# Patient Record
Sex: Female | Born: 1935 | Race: White | Hispanic: No | Smoking: Former smoker
Health system: Southern US, Community
[De-identification: ages and names within clinical notes are randomized; demographics above are authoritative.]

## PROBLEM LIST (undated history)

## (undated) DIAGNOSIS — Z7901 Long term (current) use of anticoagulants: Secondary | ICD-10-CM

## (undated) DIAGNOSIS — Z86010 Personal history of colonic polyps: Secondary | ICD-10-CM

## (undated) DIAGNOSIS — E119 Type 2 diabetes mellitus without complications: Secondary | ICD-10-CM

## (undated) DIAGNOSIS — E039 Hypothyroidism, unspecified: Secondary | ICD-10-CM

## (undated) DIAGNOSIS — I10 Essential (primary) hypertension: Secondary | ICD-10-CM

## (undated) DIAGNOSIS — M1990 Unspecified osteoarthritis, unspecified site: Secondary | ICD-10-CM

## (undated) DIAGNOSIS — I4891 Unspecified atrial fibrillation: Principal | ICD-10-CM

## (undated) DIAGNOSIS — F419 Anxiety disorder, unspecified: Secondary | ICD-10-CM

## (undated) DIAGNOSIS — F17201 Nicotine dependence, unspecified, in remission: Secondary | ICD-10-CM

## (undated) HISTORY — DX: Hypothyroidism, unspecified: E03.9

## (undated) HISTORY — DX: Nicotine dependence, unspecified, in remission: F17.201

## (undated) HISTORY — PX: OTHER SURGICAL HISTORY: SHX169

## (undated) HISTORY — DX: Personal history of colonic polyps: Z86.010

## (undated) HISTORY — DX: Unspecified atrial fibrillation: I48.91

## (undated) HISTORY — DX: Type 2 diabetes mellitus without complications: E11.9

## (undated) HISTORY — DX: Long term (current) use of anticoagulants: Z79.01

---

## 1977-07-06 HISTORY — PX: HERNIA REPAIR: SHX51

## 2004-05-23 ENCOUNTER — Ambulatory Visit (HOSPITAL_COMMUNITY): Admission: RE | Admit: 2004-05-23 | Discharge: 2004-05-23 | Payer: Self-pay | Admitting: Family Medicine

## 2004-06-04 ENCOUNTER — Ambulatory Visit (HOSPITAL_COMMUNITY): Admission: RE | Admit: 2004-06-04 | Discharge: 2004-06-04 | Payer: Self-pay | Admitting: Family Medicine

## 2005-07-06 DIAGNOSIS — Z860101 Personal history of adenomatous and serrated colon polyps: Secondary | ICD-10-CM

## 2005-07-06 DIAGNOSIS — Z86010 Personal history of colonic polyps: Secondary | ICD-10-CM

## 2005-07-06 HISTORY — DX: Personal history of colonic polyps: Z86.010

## 2005-07-06 HISTORY — PX: COLONOSCOPY W/ POLYPECTOMY: SHX1380

## 2005-07-06 HISTORY — DX: Personal history of adenomatous and serrated colon polyps: Z86.0101

## 2005-07-09 ENCOUNTER — Ambulatory Visit (HOSPITAL_COMMUNITY): Admission: RE | Admit: 2005-07-09 | Discharge: 2005-07-09 | Payer: Self-pay | Admitting: Family Medicine

## 2006-01-20 ENCOUNTER — Ambulatory Visit (HOSPITAL_COMMUNITY): Admission: RE | Admit: 2006-01-20 | Discharge: 2006-01-20 | Payer: Self-pay | Admitting: Gastroenterology

## 2006-01-20 ENCOUNTER — Ambulatory Visit: Payer: Self-pay | Admitting: Gastroenterology

## 2006-01-20 ENCOUNTER — Encounter (INDEPENDENT_AMBULATORY_CARE_PROVIDER_SITE_OTHER): Payer: Self-pay | Admitting: Specialist

## 2006-07-12 ENCOUNTER — Ambulatory Visit (HOSPITAL_COMMUNITY): Admission: RE | Admit: 2006-07-12 | Discharge: 2006-07-12 | Payer: Self-pay | Admitting: Family Medicine

## 2006-11-10 ENCOUNTER — Ambulatory Visit (HOSPITAL_COMMUNITY): Admission: RE | Admit: 2006-11-10 | Discharge: 2006-11-10 | Payer: Self-pay | Admitting: Family Medicine

## 2007-07-15 ENCOUNTER — Ambulatory Visit (HOSPITAL_COMMUNITY): Admission: RE | Admit: 2007-07-15 | Discharge: 2007-07-15 | Payer: Self-pay | Admitting: Family Medicine

## 2008-07-23 ENCOUNTER — Ambulatory Visit (HOSPITAL_COMMUNITY): Admission: RE | Admit: 2008-07-23 | Discharge: 2008-07-23 | Payer: Self-pay | Admitting: Family Medicine

## 2009-07-29 ENCOUNTER — Ambulatory Visit (HOSPITAL_COMMUNITY)
Admission: RE | Admit: 2009-07-29 | Discharge: 2009-07-29 | Payer: Self-pay | Source: Home / Self Care | Admitting: Family Medicine

## 2010-07-06 DIAGNOSIS — Z7901 Long term (current) use of anticoagulants: Secondary | ICD-10-CM

## 2010-07-06 DIAGNOSIS — I4891 Unspecified atrial fibrillation: Secondary | ICD-10-CM

## 2010-07-06 HISTORY — DX: Long term (current) use of anticoagulants: Z79.01

## 2010-07-06 HISTORY — DX: Unspecified atrial fibrillation: I48.91

## 2010-07-28 ENCOUNTER — Encounter: Payer: Self-pay | Admitting: Family Medicine

## 2010-07-31 ENCOUNTER — Ambulatory Visit (HOSPITAL_COMMUNITY)
Admission: RE | Admit: 2010-07-31 | Discharge: 2010-07-31 | Payer: Self-pay | Source: Home / Self Care | Attending: Family Medicine | Admitting: Family Medicine

## 2010-11-21 NOTE — Op Note (Signed)
NAMEKIWANA, DEBLASI                 ACCOUNT NO.:  [id number]   MEDICAL RECORD NO.:  [id number]          PATIENT TYPE:  AMB   LOCATION:  DAY                           FACILITY:  [facility]   PHYSICIAN:  Kassie Mends, M.D.      DATE OF BIRTH:  Jul 17, 1935   DATE OF PROCEDURE:  01/20/2006  DATE OF DISCHARGE:                                 OPERATIVE REPORT   PROCEDURE:  Colonoscopy with cold snare and cold forceps biopsy, snare  cautery.   MEDICATIONS:  1.  Demerol 75 mg IV.  2.  Versed 4 mg IV.   INDICATIONS FOR EXAM:  Autumn Powers is a 75 year old female who presents for  average risk colon cancer screening.   FINDINGS:  1.  Multiple 4 mm to 6 mm polyps seen throughout the colon.  The polyps were      removed using cold snare, cold forceps, and snare cautery.  2.  6 mm sessile rectal polyp removed via snare cautery.  3.  Sigmoid diverticulosis.  4.  Otherwise no masses, inflammatory changes or vascular ectasia seen.  No      internal hemorrhoids.   RECOMMENDATIONS:  1.  No aspirin, NSAIDs or anticoagulation for 7 days.  2.  Will follow up biopsies.  If polyps adenomatous, then screening      colonoscopy needs to be performed in 5 years.  3.  Follow up with Dr. Janna Arch.   PROCEDURE TECHNIQUE:  Physical exam was performed and informed consent was  obtained from the patient after explaining all risks (perforation, bleeding,  infection, adverse effects to medicines), benefits, alternatives to the  procedure which the patient appeared to understand and so stated.  The  patient was connected to the monitoring device and placed in the left  lateral position.  Continuous oxygen was provided by nasal cannula and IV  medicine administered through an indwelling cannula.  After administration  of sedation and rectal exam, the patient's rectum was intubated.  The scope  was advanced under direct visualization to the cecum.  Multiple colon polyps  and a single  rectal polyp were removed using cold  biopsy technique and snare cautery with  the Erbe at standard settings (20 W).  The scope was subsequently removed  slowly by carefully examining the color, texture, anatomy mucosa on the way  out.  The patient was recovered in the endoscopy suite and discharged home  in satisfactory condition.      Kassie Mends, M.D.  Electronically Signed     SM/MEDQ  D:  01/20/2006  T:  01/21/2006  Job:  [id number]   cc:   Melvyn Novas, MD  Fax: [phone]

## 2011-01-08 ENCOUNTER — Encounter: Payer: Self-pay | Admitting: Gastroenterology

## 2011-01-19 ENCOUNTER — Ambulatory Visit: Payer: Self-pay | Admitting: Gastroenterology

## 2011-01-27 ENCOUNTER — Encounter: Payer: Self-pay | Admitting: Gastroenterology

## 2011-01-27 ENCOUNTER — Ambulatory Visit (INDEPENDENT_AMBULATORY_CARE_PROVIDER_SITE_OTHER): Payer: Medicare Other | Admitting: Gastroenterology

## 2011-01-27 VITALS — BP 123/74 | HR 66 | Temp 97.6°F | Ht 66.0 in | Wt 97.2 lb

## 2011-01-27 DIAGNOSIS — Z86010 Personal history of colonic polyps: Secondary | ICD-10-CM

## 2011-01-27 NOTE — Patient Instructions (Signed)
We have set you up for a colonoscopy with Dr. Fields.  No changes to your medications at this time.   

## 2011-01-27 NOTE — Assessment & Plan Note (Signed)
75 year old Caucasian female with hx of adenomatous polyps on colonoscopy in 2007. Due for surveillance now. Denies abdominal pain, change in bowel habits, rectal bleeding, nausea or vomiting. She is quite thin, but she states she fluctuates between 95 and 105 lbs. Has no other concerns and desires to proceed.  Proceed with colonoscopy with Dr. Darrick Penna in the near future. The risks, benefits, and alternatives have been discussed in detail with the patient. She states understanding and desires to proceed.

## 2011-01-27 NOTE — Progress Notes (Signed)
Primary Care Physician:  Isabella Stalling, MD Primary Gastroenterologist:  Dr. Darrick Penna   Chief Complaint  Patient presents with  . Colonoscopy    HPI:   Autumn Powers is a 75 year old Caucasian female who presents today for updated surveillance colonoscopy. Last colonoscopy in 2007 with Dr. Darrick Penna, multiple polyps throughout colon. Biopsy: adenomatous and hyperplastic. She is a thin-appearing but healthy female; reports weighing between 95 and 105 lbs. She denies any change in bowel habits. Has BM every other day. No rectal bleeding. Denies N/V and abdominal pain. Denies dysphagia, odynophagia.   She has no concerns today.    Past Medical History  Diagnosis Date  . Hypothyroidism   . Osteoporosis   . Stress     r/t son's health   . S/P colonoscopy 2007    Adenomatous, hyperplastic polyps    Past Surgical History  Procedure Date  . Right inguinal hernia repair     Current Outpatient Prescriptions  Medication Sig Dispense Refill  . alendronate (FOSAMAX) 70 MG tablet Take 70 mg by mouth every 7 (seven) days. Take with a full glass of water on an empty stomach.       . levothyroxine (SYNTHROID, LEVOTHROID) 50 MCG tablet Take 50 mcg by mouth daily.        Marland Kitchen PARoxetine (PAXIL) 20 MG tablet Take 20 mg by mouth every morning.        . rosuvastatin (CRESTOR) 10 MG tablet Take 10 mg by mouth daily.          Allergies as of 01/27/2011  . (No Known Allergies)    Family History  Problem Relation Age of Onset  . Colon cancer Neg Hx     History   Social History  . Marital Status: Divorced    Spouse Name: N/A    Number of Children: N/A  . Years of Education: N/A   Occupational History  . Not on file.   Social History Main Topics  . Smoking status: Former Smoker -- 1.0 packs/day for 30 years    Types: Cigarettes  . Smokeless tobacco: Not on file   Comment: quit at the age of 89 years age  . Alcohol Use: No  . Drug Use: No  . Sexually Active: Not on file    Review of  Systems: Gen: Denies any fever, chills, fatigue, significant weight loss, lack of appetite.  CV: Denies chest pain, heart palpitations, peripheral edema, syncope.  Resp: Denies shortness of breath at rest or with exertion. Denies wheezing or cough.  GI: Denies dysphagia or odynophagia. Denies jaundice, hematemesis, fecal incontinence. GU : Denies urinary burning, urinary frequency, urinary hesitancy MS: Denies joint pain, muscle weakness, cramps, or limitation of movement.  Derm: Denies rash, itching, dry skin Psych: Denies depression, anxiety, memory loss, and confusion Heme: Denies bruising, bleeding, and enlarged lymph nodes.  Physical Exam: BP 123/74  Pulse 66  Temp(Src) 97.6 F (36.4 C) (Temporal)  Ht 5\' 6"  (1.676 m)  Wt 97 lb 3.2 oz (44.09 kg)  BMI 15.69 kg/m2 General:   Alert and oriented. Pleasant and cooperative. Thin but healthy appearing.  Head:  Normocephalic and atraumatic. Eyes:  Without icterus, sclera clear and conjunctiva pink.  Ears:  Normal auditory acuity. Nose:  No deformity, discharge,  or lesions. Mouth:  No deformity or lesions, oral mucosa pink.  Neck:  Supple, without mass or thyromegaly. Lungs:  Clear to auscultation bilaterally. Diminished bases.  Heart:  S1, S2 present without murmurs appreciated.  Abdomen:  +BS,  soft, non-tender and non-distended. Thin. No HSM noted. No guarding or rebound. No masses appreciated.  Rectal:  Deferred until colonoscopy.  Msk:  Symmetrical without gross deformities. Normal posture. Extremities:  Without clubbing or edema. Neurologic:  Alert and  oriented x4;  grossly normal neurologically. Skin:  Intact without significant lesions or rashes. Cervical Nodes:  No significant cervical adenopathy. Psych:  Alert and cooperative. Normal mood and affect.

## 2011-01-28 NOTE — Progress Notes (Signed)
Cc to PCP 

## 2011-02-04 MED ORDER — SODIUM CHLORIDE 0.45 % IV SOLN
Freq: Once | INTRAVENOUS | Status: AC
  Administered 2011-02-05: 1000 mL via INTRAVENOUS

## 2011-02-05 ENCOUNTER — Ambulatory Visit (HOSPITAL_COMMUNITY)
Admission: RE | Admit: 2011-02-05 | Discharge: 2011-02-05 | Disposition: A | Discharge status: Nursing Home (Includes ICF) | Payer: Medicare Other | Source: Ambulatory Visit | Attending: Gastroenterology | Admitting: Gastroenterology

## 2011-02-05 ENCOUNTER — Encounter (HOSPITAL_COMMUNITY)
Admission: RE | Disposition: A | Discharge status: Nursing Home (Includes ICF) | Payer: Self-pay | Source: Ambulatory Visit | Attending: Gastroenterology

## 2011-02-05 ENCOUNTER — Emergency Department (HOSPITAL_COMMUNITY)
Admission: EM | Admit: 2011-02-05 | Discharge: 2011-02-05 | Disposition: A | Discharge status: Home / Self Care | Payer: Medicare Other | Attending: Emergency Medicine | Admitting: Emergency Medicine

## 2011-02-05 ENCOUNTER — Other Ambulatory Visit: Payer: Self-pay | Admitting: Gastroenterology

## 2011-02-05 ENCOUNTER — Other Ambulatory Visit: Payer: Self-pay

## 2011-02-05 ENCOUNTER — Encounter (HOSPITAL_COMMUNITY): Payer: Self-pay | Admitting: *Deleted

## 2011-02-05 ENCOUNTER — Encounter: Payer: Medicare Other | Admitting: Gastroenterology

## 2011-02-05 DIAGNOSIS — Z860100 Personal history of colon polyps, unspecified: Secondary | ICD-10-CM | POA: Insufficient documentation

## 2011-02-05 DIAGNOSIS — M810 Age-related osteoporosis without current pathological fracture: Secondary | ICD-10-CM | POA: Insufficient documentation

## 2011-02-05 DIAGNOSIS — I498 Other specified cardiac arrhythmias: Secondary | ICD-10-CM | POA: Insufficient documentation

## 2011-02-05 DIAGNOSIS — Z87891 Personal history of nicotine dependence: Secondary | ICD-10-CM | POA: Insufficient documentation

## 2011-02-05 DIAGNOSIS — F411 Generalized anxiety disorder: Secondary | ICD-10-CM | POA: Insufficient documentation

## 2011-02-05 DIAGNOSIS — E039 Hypothyroidism, unspecified: Secondary | ICD-10-CM | POA: Insufficient documentation

## 2011-02-05 DIAGNOSIS — K648 Other hemorrhoids: Secondary | ICD-10-CM

## 2011-02-05 DIAGNOSIS — R002 Palpitations: Secondary | ICD-10-CM | POA: Insufficient documentation

## 2011-02-05 DIAGNOSIS — K5730 Diverticulosis of large intestine without perforation or abscess without bleeding: Secondary | ICD-10-CM

## 2011-02-05 DIAGNOSIS — D126 Benign neoplasm of colon, unspecified: Secondary | ICD-10-CM

## 2011-02-05 DIAGNOSIS — R000 Tachycardia, unspecified: Secondary | ICD-10-CM

## 2011-02-05 DIAGNOSIS — Z09 Encounter for follow-up examination after completed treatment for conditions other than malignant neoplasm: Secondary | ICD-10-CM | POA: Insufficient documentation

## 2011-02-05 DIAGNOSIS — M19079 Primary osteoarthritis, unspecified ankle and foot: Secondary | ICD-10-CM | POA: Insufficient documentation

## 2011-02-05 HISTORY — PX: COLONOSCOPY: SHX5424

## 2011-02-05 HISTORY — DX: Unspecified osteoarthritis, unspecified site: M19.90

## 2011-02-05 HISTORY — DX: Anxiety disorder, unspecified: F41.9

## 2011-02-05 LAB — CBC
Hemoglobin: 13 g/dL (ref 12.0–15.0)
MCH: 29.9 pg (ref 26.0–34.0)
MCV: 93.1 fL (ref 78.0–100.0)
Platelets: 186 10*3/uL (ref 150–400)
RBC: 4.35 MIL/uL (ref 3.87–5.11)
WBC: 8.2 10*3/uL (ref 4.0–10.5)

## 2011-02-05 LAB — BASIC METABOLIC PANEL
BUN: 6 mg/dL (ref 6–23)
CO2: 24 mEq/L (ref 19–32)
Glucose, Bld: 98 mg/dL (ref 70–99)
Potassium: 4.5 mEq/L (ref 3.5–5.1)
Sodium: 137 mEq/L (ref 135–145)

## 2011-02-05 LAB — DIFFERENTIAL
Eosinophils Absolute: 0.2 10*3/uL (ref 0.0–0.7)
Eosinophils Relative: 3 % (ref 0–5)
Lymphocytes Relative: 25 % (ref 12–46)
Lymphs Abs: 2 10*3/uL (ref 0.7–4.0)
Monocytes Relative: 6 % (ref 3–12)
Neutrophils Relative %: 66 % (ref 43–77)

## 2011-02-05 LAB — CARDIAC PANEL(CRET KIN+CKTOT+MB+TROPI)
CK, MB: 6.5 ng/mL (ref 0.3–4.0)
Troponin I: <0.3 ng/mL (ref <0.30)

## 2011-02-05 SURGERY — COLONOSCOPY
Anesthesia: Moderate Sedation

## 2011-02-05 MED ORDER — MIDAZOLAM HCL 5 MG/5ML IJ SOLN
INTRAMUSCULAR | Status: AC
  Filled 2011-02-05: qty 10

## 2011-02-05 MED ORDER — MIDAZOLAM HCL 5 MG/5ML IJ SOLN
INTRAMUSCULAR | Status: DC | PRN
  Administered 2011-02-05 (×2): 2 mg via INTRAVENOUS

## 2011-02-05 MED ORDER — METOPROLOL TARTRATE 1 MG/ML IV SOLN
INTRAVENOUS | Status: AC
  Filled 2011-02-05: qty 5

## 2011-02-05 MED ORDER — METOPROLOL TARTRATE 1 MG/ML IV SOLN
INTRAVENOUS | Status: AC
  Administered 2011-02-05: 2.5 mg via INTRAVENOUS
  Filled 2011-02-05: qty 5

## 2011-02-05 MED ORDER — MEPERIDINE HCL 100 MG/ML IJ SOLN
INTRAMUSCULAR | Status: DC | PRN
  Administered 2011-02-05 (×2): 25 mg via INTRAVENOUS

## 2011-02-05 MED ORDER — METOPROLOL TARTRATE 1 MG/ML IV SOLN
2.5000 mg | Freq: Once | INTRAVENOUS | Status: AC
  Administered 2011-02-05: 2.5 mg via INTRAVENOUS

## 2011-02-05 MED ORDER — STERILE WATER FOR IRRIGATION IR SOLN
Status: DC | PRN
  Administered 2011-02-05: 13:00:00

## 2011-02-05 MED ORDER — SODIUM CHLORIDE 0.9 % IV BOLUS (SEPSIS)
500.0000 mL | Freq: Once | INTRAVENOUS | Status: AC
  Administered 2011-02-05: 500 mL via INTRAVENOUS

## 2011-02-05 MED ORDER — MEPERIDINE HCL 100 MG/ML IJ SOLN
INTRAMUSCULAR | Status: AC
  Filled 2011-02-05: qty 2

## 2011-02-05 MED ORDER — ATENOLOL 25 MG PO TABS: 25.0000 mg | ORAL_TABLET | Freq: Every day | ORAL | Status: DC

## 2011-02-05 NOTE — H&P (Signed)
V12.72      Reason for Visit     Colonoscopy        Current Vitals       Recorded User        01/27/2011  1:52 PM  Ginger L Walker, NT           BP Pulse Temp (Src) Resp Ht Wt    123/74  66  97.6 F (36.4 C) (Temporal)  N/A  5\' 6"  (1.676 m)  97 lb 3.2 oz (44.09 kg)       BMI SpO2 PF LMP    15.69 kg/m2  N/A  N/A  N/A          Progress Notes     Gerrit Halls, NP  01/27/2011  4:05 PM  Signed     Primary Care Physician:  Isabella Stalling, MD Primary Gastroenterologist:  Dr. Darrick Penna     Chief Complaint   Patient presents with   .  Colonoscopy      HPI:    Autumn Powers is a 75 year old Caucasian female who presents today for updated surveillance colonoscopy. Last colonoscopy in 2007 with Dr. Darrick Penna, multiple polyps throughout colon. Biopsy: adenomatous and hyperplastic. She is a thin-appearing but healthy female; reports weighing between 95 and 105 lbs. She denies any change in bowel habits. Has BM every other day. No rectal bleeding. Denies N/V and abdominal pain. Denies dysphagia, odynophagia.    She has no concerns today.      Past Medical History   Diagnosis  Date   .  Hypothyroidism     .  Osteoporosis     .  Stress         r/t son's health    .  S/P colonoscopy  2007       Adenomatous, hyperplastic polyps       Past Surgical History   Procedure  Date   .  Right inguinal hernia repair         Current Outpatient Prescriptions   Medication  Sig  Dispense  Refill   .  alendronate (FOSAMAX) 70 MG tablet  Take 70 mg by mouth every 7 (seven) days. Take with a full glass of water on an empty stomach.          .  levothyroxine (SYNTHROID, LEVOTHROID) 50 MCG tablet  Take 50 mcg by mouth daily.           Marland Kitchen  PARoxetine (PAXIL) 20 MG tablet  Take 20 mg by mouth every morning.           .  rosuvastatin (CRESTOR) 10 MG tablet  Take 10 mg by mouth daily.               Allergies as of 01/27/2011   .  (No Known Allergies)       Family History   Problem  Relation   Age of Onset   .  Colon cancer  Neg Hx         History       Social History   .  Marital Status:  Divorced       Spouse Name:  N/A       Number of Children:  N/A   .  Years of Education:  N/A       Occupational History   .  Not on file.       Social History Main Topics   .  Smoking status:  Former Smoker -- 1.0  packs/day for 30 years       Types:  Cigarettes   .  Smokeless tobacco:  Not on file     Comment: quit at the age of 23 years age   .  Alcohol Use:  No   .  Drug Use:  No   .  Sexually Active:  Not on file        Review of Systems: Gen: Denies any fever, chills, fatigue, significant weight loss, lack of appetite.   CV: Denies chest pain, heart palpitations, peripheral edema, syncope.   Resp: Denies shortness of breath at rest or with exertion. Denies wheezing or cough.   GI: Denies dysphagia or odynophagia. Denies jaundice, hematemesis, fecal incontinence. GU : Denies urinary burning, urinary frequency, urinary hesitancy MS: Denies joint pain, muscle weakness, cramps, or limitation of movement.   Derm: Denies rash, itching, dry skin Psych: Denies depression, anxiety, memory loss, and confusion Heme: Denies bruising, bleeding, and enlarged lymph nodes.   Physical Exam: BP 123/74  Pulse 66  Temp(Src) 97.6 F (36.4 C) (Temporal)  Ht 5\' 6"  (1.676 m)  Wt 97 lb 3.2 oz (44.09 kg)  BMI 15.69 kg/m2 General:   Alert and oriented. Pleasant and cooperative. Thin but healthy appearing.   Head:  Normocephalic and atraumatic. Eyes:  Without icterus, sclera clear and conjunctiva pink.   Ears:  Normal auditory acuity. Nose:  No deformity, discharge,  or lesions. Mouth:  No deformity or lesions, oral mucosa pink.   Neck:  Supple, without mass or thyromegaly. Lungs:  Clear to auscultation bilaterally. Diminished bases.   Heart:  S1, S2 present without murmurs appreciated.   Abdomen:  +BS, soft, non-tender and non-distended. Thin. No HSM noted. No guarding or rebound.  No masses appreciated.   Rectal:  Deferred until colonoscopy.   Msk:  Symmetrical without gross deformities. Normal posture. Extremities:  Without clubbing or edema. Neurologic:  Alert and  oriented x4;  grossly normal neurologically. Skin:  Intact without significant lesions or rashes. Cervical Nodes:  No significant cervical adenopathy. Psych:  Alert and cooperative. Normal mood and affect.   Autumn Powers  01/28/2011  9:34 AM  Signed Cc to PCP        Hx of adenomatous colonic polyps - Gerrit Halls, NP  01/27/2011  4:04 PM  Signed 75 year old Caucasian female with hx of adenomatous polyps on colonoscopy in 2007. Due for surveillance now. Denies abdominal pain, change in bowel habits, rectal bleeding, nausea or vomiting. She is quite thin, but she states she fluctuates between 95 and 105 lbs. Has no other concerns and desires to proceed.   Proceed with colonoscopy with Dr. Darrick Penna in the near future. The risks, benefits, and alternatives have been discussed in detail with the patient. She states understanding and desires to proceed.

## 2011-02-05 NOTE — ED Notes (Signed)
Critical CKMB 6.5 received from [facility] in lab. Reported to MD and Primary RN.

## 2011-02-05 NOTE — Interval H&P Note (Signed)
History and Physical Interval Note:  Pt presented w/ HR > 130. No CP or SOB. Pt has Hx: tachycardia.  02/05/2011   12:33 PM   Autumn Powers  has presented today for surgery, with the diagnosis of adenomatous polyps  The various methods of treatment have been discussed with the patient and family. After consideration of risks, benefits and other options for treatment, the patient has consented to  Procedure(s): COLONOSCOPY as a surgical intervention .  I have reviewed the patients' chart and labs.  Questions were answered to the patient's satisfaction.     [name]  MD

## 2011-02-05 NOTE — ED Notes (Signed)
Pt ambulated without difficulty to restroom and back.

## 2011-02-05 NOTE — Progress Notes (Signed)
Report called to triage RN in ED. Transported to ED 18 for further evaluation of tachycardia per MD order.

## 2011-02-05 NOTE — ED Provider Notes (Signed)
History    Scribed for Joya Gaskins, MD, the patient was seen in room 18. This chart was scribed by Clarita Crane. This patient's care was started at 3:27PM  CSN: [id number] Arrival date & time: 02/05/2011  4:05 PM  Chief Complaint  Patient presents with  . Tachycardia   HPI Patient is a 75 year old female BIB EMS c/o palpitations prior to and during a routine endoscopy performed this afternoon. Patient reports she began having palpitations a few hours prior to having a colonoscopy performed this afternoon which were persistent through the procedure. Patient denies associated SOB, chest pain, abdominal pain, nausea, vomiting, syncope with episode of palpitations. . Patient reports she was feeling fine prior to the onset of palpitations and notes she currently feels fine as well. Per endoscopy nurse patient went into sinus tach during procedure and was immediately sent to ED. Patient denies h/o MI, stroke, heart disease but reports h/o hypothyroidism, osteoporosis, anxiety.  PAST MEDICAL HISTORY:  Past Medical History  Diagnosis Date  . Hypothyroidism   . Osteoporosis   . Stress     r/t son's health   . S/P colonoscopy 2007    Adenomatous, hyperplastic polyps  . Arthritis     left foot in toes  . Anxiety     on paxil "for stress"    PAST SURGICAL HISTORY:  Past Surgical History  Procedure Date  . Right inguinal hernia repair   . Hernia repair 1979    right inguinal hernia repair    MEDICATIONS:  Previous Medications   ALENDRONATE (FOSAMAX) 70 MG TABLET    Take 70 mg by mouth every 7 (seven) days. Take with a full glass of water on an empty stomach.    LEVOTHYROXINE (SYNTHROID, LEVOTHROID) 50 MCG TABLET    Take 50 mcg by mouth daily.     PAROXETINE (PAXIL) 20 MG TABLET    Take 20 mg by mouth every morning.     ROSUVASTATIN (CRESTOR) 10 MG TABLET    Take 10 mg by mouth daily.       ALLERGIES:  Allergies as of 02/05/2011  . (No Known Allergies)     FAMILY HISTORY:    Family History  Problem Relation Age of Onset  . Colon cancer Neg Hx      SOCIAL HISTORY: History   Social History  . Marital Status: Divorced    Spouse Name: N/A    Number of Children: N/A  . Years of Education: N/A   Social History Main Topics  . Smoking status: Former Smoker -- 1.0 packs/day for 30 years    Types: Cigarettes  . Smokeless tobacco: None   Comment: quit at the age of 52 years age  . Alcohol Use: No  . Drug Use: No  . Sexually Active: Not Currently   Other Topics Concern  . None   Social History Narrative  . None     OB History    Grav Para Term Preterm Abortions TAB SAB Ect Mult Living                  Review of Systems 10 Systems reviewed and are negative for acute change except as noted in the HPI.  Physical Exam  BP 96/58  Pulse 64  Temp(Src) 97.6 F (36.4 C) (Oral)  Resp 18  Ht 5\' 6"  (1.676 m)  Wt 98 lb (44.453 kg)  BMI 15.82 kg/m2  SpO2 97%  Physical Exam CONSTITUTIONAL: Well developed/well nourished HEAD AND FACE:  Normocephalic/atraumatic EYES: EOMI/PERRL ENMT: Mucous membranes moist NECK: supple no meningeal signs CV: S1/S2 noted, no murmurs/rubs/gallops noted LUNGS: Lungs are clear to auscultation bilaterally, no apparent distress ABDOMEN: soft, nontender, no rebound or guarding NEURO: Pt is awake/alert, moves all extremitiesx4 EXTREMITIES: pulses normal, full ROM SKIN: warm, color normal PSYCH: no abnormalities of mood noted  ED Course  Procedures  OTHER DATA REVIEWED: Nursing notes, vital signs, and past medical records reviewed. Previous medical records reviewed and considered All labs/vitals reviewed and considered EKG evaluated and considered  DIAGNOSTIC STUDIES:  Date: 02/05/2011  Rate: 74  Rhythm: normal sinus rhythm  QRS Axis: normal  Intervals: normal  ST/T Wave abnormalities: nonspecific ST changes  Conduction Disutrbances:nonspecific intraventricular conduction delay  Narrative Interpretation:    Old EKG Reviewed: rate is slower from EKG earlier today  EKG from endoscopy (1258) showed sinus tachycardia    LABS / RADIOLOGY: Results for orders placed during the hospital encounter of 02/05/11  CBC      Component Value Range   WBC 8.2  4.0 - 10.5 (K/uL)   RBC 4.35  3.87 - 5.11 (MIL/uL)   Hemoglobin 13.0  12.0 - 15.0 (g/dL)   HCT 16.1  09.6 - 04.5 (%)   MCV 93.1  78.0 - 100.0 (fL)   MCH 29.9  26.0 - 34.0 (pg)   MCHC 32.1  30.0 - 36.0 (g/dL)   RDW 40.9  81.1 - 91.4 (%)   Platelets 186  150 - 400 (K/uL)  DIFFERENTIAL      Component Value Range   Neutrophils Relative 66  43 - 77 (%)   Neutro Abs 5.4  1.7 - 7.7 (K/uL)   Lymphocytes Relative 25  12 - 46 (%)   Lymphs Abs 2.0  0.7 - 4.0 (K/uL)   Monocytes Relative 6  3 - 12 (%)   Monocytes Absolute 0.5  0.1 - 1.0 (K/uL)   Eosinophils Relative 3  0 - 5 (%)   Eosinophils Absolute 0.2  0.0 - 0.7 (K/uL)   Basophils Relative 0  0 - 1 (%)   Basophils Absolute 0.0  0.0 - 0.1 (K/uL)  BASIC METABOLIC PANEL      Component Value Range   Sodium 137  135 - 145 (mEq/L)   Potassium 4.5  3.5 - 5.1 (mEq/L)   Chloride 102  96 - 112 (mEq/L)   CO2 24  19 - 32 (mEq/L)   Glucose, Bld 98  70 - 99 (mg/dL)   BUN 6  6 - 23 (mg/dL)   Creatinine, Ser <7.82 (*) 0.50 - 1.10 (mg/dL)   Calcium 8.2 (*) 8.4 - 10.5 (mg/dL)   GFR calc non Af Amer NOT CALCULATED  >60 (mL/min)   GFR calc Af Amer NOT CALCULATED  >60 (mL/min)  CARDIAC PANEL(CRET KIN+CKTOT+MB+TROPI)      Component Value Range   Total CK 451 (*) 7 - 177 (U/L)   CK, MB PENDING  0.3 - 4.0 (ng/mL)   Troponin I <0.30  <0.30 (ng/mL)   Relative Index PENDING  0.0 - 2.5      PROCEDURES:  ED COURSE / COORDINATION OF CARE: Consult with patient's PCP- Dr. Janna Arch ordered. 4:15PM- Consult completed with Dr. Darrick Penna regarding patient condition during endoscopy and prior to being referred to ED.   Pt was asymptomatic during the episode of tachycardia per Dr fields She had spoke to [facility] who felt pt  could go home with atenolol and f/u tomorrow morning 4:22PM- Consult with patient PCP- Dr. Janna Arch completed.  Discussed patient's current status and patient's ED EKG. Dr. Janna Arch informed ED physician that patient had a recent normal thyroid study performed. Plan of treatment determined and agreed upon with Dr. Janna Arch.  4:37PM- Recheck with patient and spouse. Informed of consult performed with Dr. Janna Arch Patient vital signs noted and evaluated in room. Heart Rate-70 , normal. Blood Pressure-90/50 mmHg, hypotensive. Informed of intent to d/c home pending how patient feels in the coming hour and reassessment at that time 5:58PM- Patient further informed of course of treatment Dr. Janna Arch suggests prior to follow up with him tomorrow. Patient explained reason for ED physician intent to withhold administering any BP medications until follow-up with PCP tomorrow. Physician entertained and answered patient questions. Patient states she feels fine currently. Informed of intent to d/c home. Informed of pertinent reasons to return to ED for evaluation   Pt ambulatory in Ed Stable for d/c No significant dysrhythmia noted    PLAN: Discharge The patient is to return the emergency department if there is any worsening of symptoms. I have reviewed the discharge instructions with the patient/family   CONDITION ON DISCHARGE: Improved   MEDICATIONS GIVEN IN THE E.D.  Medications  sodium chloride 0.9 % bolus 500 mL (500 mL Intravenous Given 02/05/11 1603)       I personally performed the services described in this documentation, which was scribed in my presence. The recorded information has been reviewed and considered. Joya Gaskins, MD     Joya Gaskins, MD 02/05/11 2124

## 2011-02-05 NOTE — OR Nursing (Signed)
Pt with Heart rate of 125-158; Dr Darrick Penna aware; blood pressure within normal limits; respirations 25; hemodynamically stable; continue to monitor.

## 2011-02-05 NOTE — ED Notes (Signed)
Pt brought from endoscopy via stretcher. Pt was having an endoscopy and went into SVT per endo nurse. Pt was given Metoprolol IV - total of 7.5 mg. Pt's B/P decreased to 78 diastolic after metoprolol given. Pt has had a total of 1700 ml 1/2 NS IV. Per endo pt's heart rate became irregular with a rate of 123 and her blood pressure came up to 100/73. Pt states that her heart felt like it was racing but no other symptoms.

## 2011-02-06 NOTE — Progress Notes (Signed)
AGREE

## 2011-02-10 ENCOUNTER — Telehealth: Payer: Self-pay | Admitting: Gastroenterology

## 2011-02-10 NOTE — Telephone Encounter (Signed)
Please call pt. She had simple adenomas removed from her colon. TCS in 5 years. High fiber diet. ALL FIRST DEGREE RELATIVES NEED TCS AT AGE 73.

## 2011-02-10 NOTE — Telephone Encounter (Signed)
Results Cc to PCP  

## 2011-02-12 NOTE — Telephone Encounter (Signed)
Pt aware.

## 2011-02-13 ENCOUNTER — Encounter (HOSPITAL_COMMUNITY): Payer: Self-pay | Admitting: Gastroenterology

## 2011-02-16 NOTE — Telephone Encounter (Signed)
Reminder in epic to have tcs in 5 yrs

## 2011-03-30 ENCOUNTER — Emergency Department (HOSPITAL_COMMUNITY): Payer: Medicare Other

## 2011-03-30 ENCOUNTER — Inpatient Hospital Stay (HOSPITAL_COMMUNITY)
Admission: EM | Admit: 2011-03-30 | Discharge: 2011-04-05 | DRG: 310 | Disposition: A | Discharge status: Home / Self Care | Payer: Medicare Other | Attending: Internal Medicine | Admitting: Internal Medicine

## 2011-03-30 ENCOUNTER — Inpatient Hospital Stay: Admission: AD | Admit: 2011-03-30 | Payer: Self-pay | Source: Ambulatory Visit | Admitting: Family Medicine

## 2011-03-30 DIAGNOSIS — J4489 Other specified chronic obstructive pulmonary disease: Secondary | ICD-10-CM | POA: Diagnosis present

## 2011-03-30 DIAGNOSIS — E039 Hypothyroidism, unspecified: Secondary | ICD-10-CM | POA: Diagnosis present

## 2011-03-30 DIAGNOSIS — I4892 Unspecified atrial flutter: Principal | ICD-10-CM | POA: Diagnosis present

## 2011-03-30 DIAGNOSIS — Z23 Encounter for immunization: Secondary | ICD-10-CM

## 2011-03-30 DIAGNOSIS — Z8673 Personal history of transient ischemic attack (TIA), and cerebral infarction without residual deficits: Secondary | ICD-10-CM

## 2011-03-30 DIAGNOSIS — I5189 Other ill-defined heart diseases: Secondary | ICD-10-CM | POA: Diagnosis present

## 2011-03-30 DIAGNOSIS — Z7982 Long term (current) use of aspirin: Secondary | ICD-10-CM

## 2011-03-30 DIAGNOSIS — F411 Generalized anxiety disorder: Secondary | ICD-10-CM | POA: Diagnosis present

## 2011-03-30 DIAGNOSIS — I4891 Unspecified atrial fibrillation: Secondary | ICD-10-CM | POA: Diagnosis present

## 2011-03-30 DIAGNOSIS — M810 Age-related osteoporosis without current pathological fracture: Secondary | ICD-10-CM | POA: Diagnosis present

## 2011-03-30 DIAGNOSIS — J449 Chronic obstructive pulmonary disease, unspecified: Secondary | ICD-10-CM | POA: Diagnosis present

## 2011-03-30 DIAGNOSIS — Z79899 Other long term (current) drug therapy: Secondary | ICD-10-CM

## 2011-03-30 DIAGNOSIS — I4510 Unspecified right bundle-branch block: Secondary | ICD-10-CM | POA: Diagnosis present

## 2011-03-30 LAB — COMPREHENSIVE METABOLIC PANEL
ALT: 41 U/L — ABNORMAL HIGH (ref 0–35)
Albumin: 3.8 g/dL (ref 3.5–5.2)
Alkaline Phosphatase: 77 U/L (ref 39–117)
Chloride: 102 mEq/L (ref 96–112)
GFR calc Af Amer: >60 mL/min (ref >60)
Glucose, Bld: 96 mg/dL (ref 70–99)
Potassium: 4.4 mEq/L (ref 3.5–5.1)
Sodium: 139 mEq/L (ref 135–145)
Total Bilirubin: 0.3 mg/dL (ref 0.3–1.2)
Total Protein: 6.8 g/dL (ref 6.0–8.3)

## 2011-03-30 LAB — CBC
HCT: 40.2 % (ref 36.0–46.0)
HCT: 41.1 % (ref 36.0–46.0)
Hemoglobin: 13.7 g/dL (ref 12.0–15.0)
Hemoglobin: 13.8 g/dL (ref 12.0–15.0)
MCH: 30 pg (ref 26.0–34.0)
RBC: 4.48 MIL/uL (ref 3.87–5.11)
RBC: 4.57 MIL/uL (ref 3.87–5.11)
WBC: 9.9 10*3/uL (ref 4.0–10.5)

## 2011-03-30 LAB — DIFFERENTIAL
Basophils Absolute: 0 10*3/uL (ref 0.0–0.1)
Basophils Relative: 0 % (ref 0–1)
Basophils Relative: 1 % (ref 0–1)
Lymphocytes Relative: 28 % (ref 12–46)
Lymphocytes Relative: 28 % (ref 12–46)
Lymphs Abs: 2.8 10*3/uL (ref 0.7–4.0)
Monocytes Absolute: 0.7 10*3/uL (ref 0.1–1.0)
Monocytes Relative: 7 % (ref 3–12)
Neutro Abs: 6.1 10*3/uL (ref 1.7–7.7)
Neutro Abs: 6.3 10*3/uL (ref 1.7–7.7)
Neutrophils Relative %: 62 % (ref 43–77)
Neutrophils Relative %: 63 % (ref 43–77)

## 2011-03-30 LAB — PROTIME-INR: Prothrombin Time: 14.4 seconds (ref 11.6–15.2)

## 2011-03-30 LAB — POCT I-STAT, CHEM 8
BUN: 10 mg/dL (ref 6–23)
Calcium, Ion: 1.22 mmol/L (ref 1.12–1.32)
Chloride: 101 mEq/L (ref 96–112)
Creatinine, Ser: 0.6 mg/dL (ref 0.50–1.10)
Glucose, Bld: 88 mg/dL (ref 70–99)
TCO2: 28 mmol/L (ref 0–100)

## 2011-03-30 LAB — URINALYSIS, ROUTINE W REFLEX MICROSCOPIC
Bilirubin Urine: NEGATIVE
Glucose, UA: NEGATIVE mg/dL
Ketones, ur: NEGATIVE mg/dL
Protein, ur: NEGATIVE mg/dL
pH: 7 (ref 5.0–8.0)

## 2011-03-30 LAB — URINE MICROSCOPIC-ADD ON

## 2011-03-30 LAB — TROPONIN I: Troponin I: <0.3 ng/mL (ref <0.30)

## 2011-03-31 LAB — COMPREHENSIVE METABOLIC PANEL
ALT: 41 U/L — ABNORMAL HIGH (ref 0–35)
Alkaline Phosphatase: 77 U/L (ref 39–117)
BUN: 9 mg/dL (ref 6–23)
CO2: 29 mEq/L (ref 19–32)
Calcium: 8.5 mg/dL (ref 8.4–10.5)
Glucose, Bld: 96 mg/dL (ref 70–99)
Sodium: 142 mEq/L (ref 135–145)

## 2011-03-31 LAB — CBC
MCH: 29.2 pg (ref 26.0–34.0)
Platelets: 142 10*3/uL — ABNORMAL LOW (ref 150–400)
RBC: 4.42 MIL/uL (ref 3.87–5.11)
WBC: 9.7 10*3/uL (ref 4.0–10.5)

## 2011-03-31 LAB — MRSA PCR SCREENING: MRSA by PCR: NEGATIVE

## 2011-03-31 LAB — LIPID PANEL
Cholesterol: 122 mg/dL (ref 0–200)
HDL: 53 mg/dL (ref >39)

## 2011-03-31 LAB — URINE CULTURE
Colony Count: 30000
Culture  Setup Time: 201209250221

## 2011-03-31 LAB — TSH: TSH: 2.78 u[IU]/mL (ref 0.350–4.500)

## 2011-03-31 LAB — HEPARIN LEVEL (UNFRACTIONATED): Heparin Unfractionated: 0.23 IU/mL — ABNORMAL LOW (ref 0.30–0.70)

## 2011-04-01 LAB — CBC
HCT: 38.6 % (ref 36.0–46.0)
MCH: 29.6 pg (ref 26.0–34.0)
MCHC: 33.2 g/dL (ref 30.0–36.0)
MCV: 89.4 fL (ref 78.0–100.0)
Platelets: 141 10*3/uL — ABNORMAL LOW (ref 150–400)
RDW: 14.4 % (ref 11.5–15.5)
WBC: 11.7 10*3/uL — ABNORMAL HIGH (ref 4.0–10.5)

## 2011-04-02 DIAGNOSIS — I4891 Unspecified atrial fibrillation: Secondary | ICD-10-CM

## 2011-04-02 LAB — CBC
HCT: 37 % (ref 36.0–46.0)
Hemoglobin: 12.1 g/dL (ref 12.0–15.0)
MCH: 29.3 pg (ref 26.0–34.0)
MCHC: 32.7 g/dL (ref 30.0–36.0)
MCV: 89.6 fL (ref 78.0–100.0)
RDW: 14.7 % (ref 11.5–15.5)

## 2011-04-03 LAB — CBC
Hemoglobin: 12.9 g/dL (ref 12.0–15.0)
MCH: 29.6 pg (ref 26.0–34.0)
Platelets: 133 10*3/uL — ABNORMAL LOW (ref 150–400)
RBC: 4.36 MIL/uL (ref 3.87–5.11)

## 2011-04-03 LAB — PROTIME-INR
INR: 2.19 — ABNORMAL HIGH (ref 0.00–1.49)
Prothrombin Time: 24.7 seconds — ABNORMAL HIGH (ref 11.6–15.2)

## 2011-04-03 LAB — HEPARIN LEVEL (UNFRACTIONATED): Heparin Unfractionated: 0.36 IU/mL (ref 0.30–0.70)

## 2011-04-04 LAB — URINALYSIS, DIPSTICK ONLY
Glucose, UA: NEGATIVE mg/dL
Protein, ur: NEGATIVE mg/dL
Specific Gravity, Urine: 1.009 (ref 1.005–1.030)

## 2011-04-04 LAB — PROTIME-INR
INR: 2.13 — ABNORMAL HIGH (ref 0.00–1.49)
Prothrombin Time: 24.2 seconds — ABNORMAL HIGH (ref 11.6–15.2)

## 2011-04-05 LAB — PROTIME-INR
INR: 2.42 — ABNORMAL HIGH (ref 0.00–1.49)
Prothrombin Time: 26.7 seconds — ABNORMAL HIGH (ref 11.6–15.2)

## 2011-04-05 LAB — URINE CULTURE

## 2011-04-06 ENCOUNTER — Ambulatory Visit (INDEPENDENT_AMBULATORY_CARE_PROVIDER_SITE_OTHER): Payer: Medicare Other | Admitting: *Deleted

## 2011-04-06 DIAGNOSIS — Z7901 Long term (current) use of anticoagulants: Secondary | ICD-10-CM

## 2011-04-06 DIAGNOSIS — I749 Embolism and thrombosis of unspecified artery: Secondary | ICD-10-CM

## 2011-04-06 DIAGNOSIS — I8290 Acute embolism and thrombosis of unspecified vein: Secondary | ICD-10-CM

## 2011-04-06 LAB — POCT INR: INR: 2.9

## 2011-04-09 ENCOUNTER — Ambulatory Visit (INDEPENDENT_AMBULATORY_CARE_PROVIDER_SITE_OTHER): Payer: Medicare Other | Admitting: *Deleted

## 2011-04-09 DIAGNOSIS — Z7901 Long term (current) use of anticoagulants: Secondary | ICD-10-CM

## 2011-04-09 DIAGNOSIS — I8290 Acute embolism and thrombosis of unspecified vein: Secondary | ICD-10-CM

## 2011-04-09 DIAGNOSIS — I749 Embolism and thrombosis of unspecified artery: Secondary | ICD-10-CM

## 2011-04-13 NOTE — Discharge Summary (Signed)
Autumn Powers, Autumn Powers                 ACCOUNT NO.:  [id number]  MEDICAL RECORD NO.:  [id number]  LOCATION:  2007                         FACILITY:  [facility]  PHYSICIAN:  Dr. Patty Sermons          DATE OF BIRTH:  [birth year]  DATE OF ADMISSION:  03/30/2011 DATE OF DISCHARGE:  04/05/2011                              DISCHARGE SUMMARY   DISCHARGE DIAGNOSES: 1. Newly recognized atrial flutter/atrial fibrillation.     a.     TEE on April 02, 2011, with possible thrombus, on      anticoagulation with Coumadin.     b.     Plan for cardioversion 4-6 weeks post anticoagulation.     c.     Likely transient ischemic attack in August 2012.     d.     EF of 50-55% by TEE on April 02, 2011. 2. Hypothyroidism. 3. Osteoporosis. 4. Anxiety. 5. Colon polyps by colonoscopy August 2012 felt to be tubular adenomas     and hyperplastic polyps.  HOSPITAL COURSE:  Autumn Powers is a 75 year old female with a history of hypothyroidism and anxiety who presented to [facility] for evaluation of tachycardia at the request of her PCP.  She has no formal cardiac history, but in August was sent to the ER  after going into tachycardia during her colonoscopy.  She was evaluated by the ER physician and felt to have a sinus tachycardia.  She was asymptomatic and was sent home with atenolol and had been doing well.  However, the EKG may have represented atrial flutter at that time.  She also noted that she had an episode of facial drooping in August in retrospect felt to represent probable TIA.  She presented to her PCP for regularly scheduled followup and was known to have a heart rate in 130s to 140s and was subsequently sent to the ER for further evaluation.  She received IV adenosine with no improvement in her heart rate.  She was placed on Cardizem drip after a bolus and was noted to be in atrial flutter with heart rates in the 50s to 70s.  Cardiac enzymes were negative.  Her lab work was relatively  unremarkable with the exception of mildly elevated LFTs.  She was kept on her diltiazem drip with plans for TEE/cardioversion.  She underwent the TEE portion on April 02, 2011, which unfortunately revealed that she had a possible thrombus in the appendage.  Thus she was started on Coumadin in conjunction with the heparin that had already been started.  Focus shifted tentatively to rate control plans for cardioversion after 4-6 weeks of anticoagulation. The patient was nearing discharge yesterday, but developed elevated heart rates in the 131-150 range.  Her diltiazem was increased with improvement in her heart rate to the 80s.  She also complained of dysuria today with negative nitrite and large leukocytes in her UA, culture pending.  Dr. Patty Sermons initiated amoxicillin empirically.  Dr. Patty Sermons has seen and examined her today and felt she is stable for discharge.  DISCHARGE LABS:  WBC 8.9, hemoglobin 12.9, hematocrit 39.3, platelet count 133, INR 2.42.  Sodium 142 potassium 3.7, chloride was 106,  CO2 of 29, glucose 96, BUN 9, creatinine 0.47.  LFTs were normal with exception of mildly elevated AST of 48, and ALT of 41, total cholesterol 122, triglycerides 51, HDL 53, LDL 57.  Urine cultures is still pending at this time.  TSH of 2.780.  STUDIES: 1. CT of the head, done for her TIA like symptoms earlier in the     summer demonstrated no acute intracranial findings.  This was on     March 30, 2011. 2. Chest x-ray March 30, 2011, showed COPD without any findings. 3. TEE and April 02, 2011, showed EF of 50-55% with no wall motion     abnormalities.  Mild mitral regurgitation.  Possible thrombus in     left atrial appendage.  Moderately dilated left atrium and mildly     dilated right ventricle.  Moderately dilated right atrium.     Moderate tricuspid regurgitation.  DISCHARGE MEDICATIONS: 1. Amoxicillin 250 mg b.i.d. x3 days. 2. Coumadin 4 mg daily.  I discussed her  dosing with pharmacy who     recommended 4 mg daily with early INR checks. 3. Diltiazem 240 mg daily. 4. Digoxin 0.125 mg daily. 5. Aspirin 81 mg every morning. 6. Calcium carbonate/vitamin D one tablet b.i.d. 7. Crestor 20 mg __________. 8. Fosamax 70 mg weekly on Sundays. 9. Multivitamin one tablet every morning. 10.Paxil 20 mg every morning. 11.Synthroid 50 mcg every morning. 12.Vitamin C one tablet daily.  DISPOSITION:  Autumn Powers will be discharged in stable condition to home. She is to follow a low-sodium heart-healthy diet and keep her IV site clean and dry.  She will follow up with [facility] in [place] since that is where she lives and our office will call her with this appointment.  She will also have her INR checked either Monday or Tuesday in [place], and our office will call her with this as well. She is also instructed to follow up with her PCP for her UTI.  DURATION OF DISCHARGE ENCOUNTER:  Greater than 30 minutes including physician and PA time.     Ronie Spies, P.A.C.   ______________________________ Dr. Patty Sermons    DD/MEDQ  D:  04/05/2011  T:  04/05/2011  Job:  [id number]  cc:   Melvyn Novas, MD  Electronically Signed by Ronie Spies  on 04/13/2011 09:40:25 AM

## 2011-04-13 NOTE — H&P (Addendum)
Autumn Powers, Autumn Powers NO.:  [id number]  MEDICAL RECORD NO.:  [id number]  LOCATION:  2919                         FACILITY:  [facility]  PHYSICIAN:  Pricilla Riffle, MD, FACCDATE OF BIRTH:  10-20-1935  DATE OF ADMISSION:  03/30/2011 DATE OF DISCHARGE:                             HISTORY & PHYSICAL   PRIMARY CARDIOLOGIST:  New.  PRIMARY MEDICAL DOCTOR:  Melvyn Novas, MD  CHIEF COMPLAINT:  Fast heart rate.  REASON FOR ADMISSION:  Atrial flutter.  HISTORY OF PRESENT ILLNESS:  Ms. Benning is a 75 year old female with a history of hypothyroidism and anxiety who presents to [facility] for evaluation of tachycardia with newly diagnosed atrial flutter.  This summer, she was sent to the ER after going into tachycardia during colonoscopy and her rhythm at that time was felt to be sinus tachycardia.  She was asymptomatic and blood pressure was 90/50 and she was sent home with atenolol.  However, this EKG may have represented an atrial flutter at that time.  The patient has had some dizziness over the last month or so.  She had one episode of syncope in June when she bent down to unplug something and passed out.  No palpitations.  She does not remember dizziness preceding this particular episode.  She also recalls an episode of facial drooping over the summer, transient, which resolved completely.  She saw her primary care provider in August and apparently had carotid Dopplers and 2-D echocardiogram, the results of which are not available at this time. She denies any chest pain, shortness of breath, or dyspnea on exertion. She is currently unaware of her heart rate being irregular.  She saw her PCP today for regularly scheduled followup and was noted to have an elevated heart rate in the 130s-140s and was subsequently stent to the ER for further evaluation.  She received IV adenosine with no improvement in her heart rate.  She was placed on a Cardizem  drip after bolus and was noted to be in atrial flutter with heart rates from the 50s to the 70s.  She currently feels well.  PAST MEDICAL HISTORY: 1. Tachycardia as discussed above, likely atrial flutter in early     August. 2. Hypothyroidism. 3. Osteoporosis. 4. Anxiety. 5. Colon polyps by colonoscopy in August 2012, felt to be tubular     adenomas and hyperplastic polyps.  MEDICATIONS AT HOME: 1. Atenolol 50 mg daily. 2. Crestor 10 mg daily. 3. Topamax. 4. Paxil 20 mg daily. 5. Synthroid 50 mcg daily. 6. Calcium. 7. Aspirin 81 mg.  ALLERGIES:  No known drug allergies.  SOCIAL HISTORY:  The patient is from [place].  She is a former tobacco user.  She is widowed.  She is accompanied by her son and his wife today.  She denies any alcohol use.  FAMILY HISTORY:  Positive for CVA in her mother and MI/coronary artery disease in her father.  REVIEW OF SYSTEMS:  Positive for transient right droop with change in speech in August 2012, resolved.  No nausea, vomiting, or diarrhea.  No urinary urgency.  No fevers or chills.  All other systems reviewed and otherwise negative.  LABORATORY  DATA:  WBC 9.8, hemoglobin 13.7, hematocrit 41.1, and platelet count 144.  Potassium 4.4, sodium 140, chloride 102, CO2 of 28, glucose 96, BUN 10, creatinine 0.47, AST was mildly elevated at 51, ALT was 41, albumin 3.8 and alka phos 77.  Cardiac enzymes negative x1.  EKG:  Atrial flutter with 2:1 conduction and an incomplete right bundle- branch block.  RADIOLOGIC STUDIES:  Chest x-ray showed COPD without active disease.  PHYSICAL EXAM:  VITAL SIGNS:  Temperature 97.3, pulse 148-174 now, respirations 18, blood pressure and 97/66, and pulse ox 100% on 2 liters. GENERAL:  This is a pleasant elderly white female in no acute distress. HEENT:  Normocephalic and atraumatic with extraocular movements intact and clear sclerae.  Nares are without discharge. NECK:  Supple without carotid bruit or  JVD. HEART:  Auscultation to the heart reveals regular rhythm, with a controlled rate, S1 and S2, without S3. LUNGS:  Lung sounds have decreased air flow, but no obvious wheezes or rales. ABDOMEN:  Soft, nontender, and nondistended.  Positive bowel sounds. EXTREMITIES:  Warm, dry, and without edema.  She has 2+ pedal pulses. NEUROLOGICAL:  She is alert and oriented x3, responds questions appropriately with a normal affect.  ASSESSMENT/PLAN:  The patient was seen and examined by Dr. Tenny Craw and myself.  This is a 75 year old female with a history of hypothyroidism and anxiety who presents for evaluation of tachycardia found to be newly diagnosed atrial flutter.  Review of EKG in early August revealed that she may have had atrial flutter at that time.  It is unclear the duration of her arrhythmia, but suspect that she has been in this for quite awhile.  She was placed on atenolol at home and has had intermittent dizziness, one episode of syncope, and one spell suspicious for TIA.  She is now on diltiazem with heart rates in the 50s-80s with marginal blood pressure 90s-100s.  We will admit the patient to the hospital and obtain consent for least information regarding her prior echo and carotid Doppler report.  We will obtain a CT of the head noncontrast to rule out CVA given her questionable TIA event.  The patient will need to be anticoagulated given her possible TIA with heparin if her CT is negative for bleed.  We will maintain her on the diltiazem and continue the remainder of her home medications with the exception of atenolol.  She will have a 2-D echocardiogram early the morning of March 31, 2011.  If her EF is down, she will likely need an ischemic rule out with catheterization.  However, she does not have any symptoms to suggest that there is ongoing ischemia. With her marginal blood pressure, we will gently hydrate her.  We would also recommend to consider TEE and either  cardioversion versus ablation. We will review with the EP.  The preliminary plan is to start Coumadin once her workup is completed.     Ronie Spies, P.A.C.   ______________________________ Pricilla Riffle, MD, [facility]    DD/MEDQ  D:  03/30/2011  T:  03/30/2011  Job:  [id number]  cc:   Melvyn Novas, MD  Electronically Signed by Ronie Spies  on 04/13/2011 09:40:26 AM Electronically Signed by Dietrich Pates MD [facility] on 04/16/2011 10:22:20 AM

## 2011-04-15 ENCOUNTER — Ambulatory Visit (INDEPENDENT_AMBULATORY_CARE_PROVIDER_SITE_OTHER): Payer: Medicare Other | Admitting: *Deleted

## 2011-04-15 DIAGNOSIS — I749 Embolism and thrombosis of unspecified artery: Secondary | ICD-10-CM

## 2011-04-15 DIAGNOSIS — Z7901 Long term (current) use of anticoagulants: Secondary | ICD-10-CM

## 2011-04-15 DIAGNOSIS — I8290 Acute embolism and thrombosis of unspecified vein: Secondary | ICD-10-CM

## 2011-04-15 LAB — POCT INR: INR: 2.5

## 2011-04-21 ENCOUNTER — Encounter: Payer: Self-pay | Admitting: Adult Health

## 2011-04-22 ENCOUNTER — Encounter: Payer: Self-pay | Admitting: Adult Health

## 2011-04-22 ENCOUNTER — Ambulatory Visit (INDEPENDENT_AMBULATORY_CARE_PROVIDER_SITE_OTHER): Payer: Medicare Other | Admitting: Adult Health

## 2011-04-22 ENCOUNTER — Ambulatory Visit (INDEPENDENT_AMBULATORY_CARE_PROVIDER_SITE_OTHER): Payer: Medicare Other | Admitting: *Deleted

## 2011-04-22 VITALS — BP 96/0 | HR 90 | Resp 16 | Ht 66.0 in | Wt 99.0 lb

## 2011-04-22 DIAGNOSIS — IMO0002 Reserved for concepts with insufficient information to code with codable children: Secondary | ICD-10-CM

## 2011-04-22 DIAGNOSIS — Z7901 Long term (current) use of anticoagulants: Secondary | ICD-10-CM

## 2011-04-22 DIAGNOSIS — I8290 Acute embolism and thrombosis of unspecified vein: Secondary | ICD-10-CM

## 2011-04-22 DIAGNOSIS — I749 Embolism and thrombosis of unspecified artery: Secondary | ICD-10-CM

## 2011-04-22 DIAGNOSIS — I4891 Unspecified atrial fibrillation: Secondary | ICD-10-CM

## 2011-04-22 LAB — POCT INR: INR: 2.2

## 2011-04-22 NOTE — Assessment & Plan Note (Signed)
I have reviewed the EKG and need for coumadin with Mrs. Bently. I have answered questions concerning her arrhythmia. She is asymptomatic and tolerating the medications without side effects. She has had two therapeutic INR's per coumadin clinic. She will need two more therapeutic levels to be considered for TEE Cardioversion. She will follow-up with Dr. Dietrich Pates in 3-4 weeks for discussion and planning.  No changes in her medications.

## 2011-04-22 NOTE — Progress Notes (Signed)
HPI: Autumn Powers is a pleasant 75 y/o patient with recent admission to [facility] hospital for new onset atrial fib/flutter. She was seen by Dr.'s Tenny Craw and Patty Sermons at that time.  She was planned for a TEE cardioversion but the TEE portion discovered a possible thrombus in the atrial appendage.  She was placed on coumadin therapy and also started on PO cardizem and digoxin for HR control. She was ruled out for  MI.     She is without complaint of bleeding, palpitations or dizziness.  She was unaware of irregular heart rhythm as this was found incidentally on routine office visit with Dr. Delbert Harness prior to admission.      Of note, in August, prior to admission in Sept, she had was is now believed to be a TIA. She has transient slurred speech and facial drooping. Echo did show normal EF of 50-55%.  INR was completed in the office today with result 2.2; one week prior it was 2.5 per coumadin clinic in [place] office.  No Known Allergies  Current Outpatient Prescriptions  Medication Sig Dispense Refill  . alendronate (FOSAMAX) 70 MG tablet Take 70 mg by mouth every 7 (seven) days. Take with a full glass of water on an empty stomach.       . Ascorbic Acid (VITAMIN C PO) Take by mouth daily.        Marland Kitchen aspirin 81 MG tablet Take 81 mg by mouth daily.        . Calcium Carbonate-Vitamin D (CALCIUM + D PO) Take by mouth daily.        . digoxin (LANOXIN) 0.125 MG tablet Take 125 mcg by mouth daily.        Marland Kitchen diltiazem (CARDIZEM CD) 240 MG 24 hr capsule Take 240 mg by mouth daily.        Marland Kitchen levothyroxine (SYNTHROID, LEVOTHROID) 50 MCG tablet Take 50 mcg by mouth daily.        . Multiple Vitamin (MULTI-VITAMIN PO) Take by mouth daily.        Marland Kitchen PARoxetine (PAXIL) 20 MG tablet Take 20 mg by mouth every morning.       . rosuvastatin (CRESTOR) 10 MG tablet Take 20 mg by mouth daily.       Marland Kitchen warfarin (COUMADIN) 4 MG tablet Take 4 mg by mouth as directed.          Past Medical History  Diagnosis Date  .  Hypothyroidism   . Osteoporosis   . Stress     r/t son's health   . S/P colonoscopy 2007    Adenomatous, hyperplastic polyps  . Arthritis     left foot in toes  . Anxiety     on paxil "for stress"  . Arrhythmia     tachycardia  . Anxiety   . Osteoporosis   . Colon polyp     Past Surgical History  Procedure Date  . Right inguinal hernia repair   . Hernia repair 1979    right inguinal hernia repair  . Colonoscopy 02/05/2011    Procedure: COLONOSCOPY;  Surgeon: Arlyce Harman, MD;  Location: [facility];  Service: Endoscopy;  Laterality: N/A;    EAV:WUJWJX of systems complete and found to be negative unless listed above PHYSICAL EXAM BP 96/0  Pulse 90  Resp 16  Ht 5\' 6"  (1.676 m)  Wt 44.906 kg (99 lb)  BMI 15.98 kg/m2  General: Well developed, well nourished, thin, in no acute distress Head: Eyes PERRLA,  No xanthomas.   Normal cephalic and atramatic  Lungs: Clear bilaterally to auscultation and percussion. Heart: HRIR   Pulses are 2+ & equal.            No carotid bruit. No JVD.  No abdominal bruits. No femoral bruits. Abdomen: Bowel sounds are positive, abdomen soft and non-tender without masses or                  Hernia's noted. Msk:  Back normal, normal gait. Normal strength and tone for age. Extremities: No clubbing, cyanosis or edema.  DP +1 Neuro: Alert and oriented X 3. Psych:  Good affect, responds appropriately  EKG: Atrial fibrillation with RBBB rate of 99bpm.  ASSESSMENT AND PLAN

## 2011-04-22 NOTE — Patient Instructions (Signed)
Your physician recommends that you schedule a follow-up appointment in: 3 weeks to discuss cardioversion.

## 2011-04-29 ENCOUNTER — Ambulatory Visit (INDEPENDENT_AMBULATORY_CARE_PROVIDER_SITE_OTHER): Payer: Medicare Other | Admitting: *Deleted

## 2011-04-29 DIAGNOSIS — I749 Embolism and thrombosis of unspecified artery: Secondary | ICD-10-CM

## 2011-04-29 DIAGNOSIS — Z7901 Long term (current) use of anticoagulants: Secondary | ICD-10-CM

## 2011-04-29 DIAGNOSIS — I8290 Acute embolism and thrombosis of unspecified vein: Secondary | ICD-10-CM

## 2011-04-29 LAB — POCT INR: INR: 2

## 2011-05-06 ENCOUNTER — Ambulatory Visit (INDEPENDENT_AMBULATORY_CARE_PROVIDER_SITE_OTHER): Payer: Medicare Other | Admitting: *Deleted

## 2011-05-06 DIAGNOSIS — I749 Embolism and thrombosis of unspecified artery: Secondary | ICD-10-CM

## 2011-05-06 DIAGNOSIS — I8290 Acute embolism and thrombosis of unspecified vein: Secondary | ICD-10-CM

## 2011-05-06 DIAGNOSIS — Z7901 Long term (current) use of anticoagulants: Secondary | ICD-10-CM

## 2011-05-14 ENCOUNTER — Encounter: Payer: Self-pay | Admitting: Cardiology

## 2011-05-14 ENCOUNTER — Ambulatory Visit (INDEPENDENT_AMBULATORY_CARE_PROVIDER_SITE_OTHER): Payer: Medicare Other | Admitting: *Deleted

## 2011-05-14 ENCOUNTER — Ambulatory Visit (INDEPENDENT_AMBULATORY_CARE_PROVIDER_SITE_OTHER): Payer: Medicare Other | Admitting: Cardiology

## 2011-05-14 VITALS — BP 118/67 | HR 80 | Ht 66.0 in | Wt 100.0 lb

## 2011-05-14 DIAGNOSIS — F17201 Nicotine dependence, unspecified, in remission: Secondary | ICD-10-CM | POA: Insufficient documentation

## 2011-05-14 DIAGNOSIS — I8290 Acute embolism and thrombosis of unspecified vein: Secondary | ICD-10-CM

## 2011-05-14 DIAGNOSIS — E039 Hypothyroidism, unspecified: Secondary | ICD-10-CM | POA: Insufficient documentation

## 2011-05-14 DIAGNOSIS — I4891 Unspecified atrial fibrillation: Secondary | ICD-10-CM

## 2011-05-14 DIAGNOSIS — Z7901 Long term (current) use of anticoagulants: Secondary | ICD-10-CM

## 2011-05-14 DIAGNOSIS — I749 Embolism and thrombosis of unspecified artery: Secondary | ICD-10-CM

## 2011-05-14 DIAGNOSIS — M810 Age-related osteoporosis without current pathological fracture: Secondary | ICD-10-CM | POA: Insufficient documentation

## 2011-05-14 NOTE — Assessment & Plan Note (Signed)
Stool for Hemoccult testing and a CBC will be obtained to exclude occult GI blood loss.

## 2011-05-14 NOTE — Patient Instructions (Addendum)
Your physician recommends that you schedule a follow-up appointment in: 3 weeks after Cardioversion  Your physician has recommended you make the following change in your medication:  Restart Atenolol 25 mg daily  Cardioversion on Wednesday November 19th @ 9:00.  Report to Short Stay Admissions at 8:00AM.   Nothing to eat or drink after midnight May take am meds

## 2011-05-14 NOTE — Progress Notes (Signed)
HPI : Ms. Furno returns to the office as scheduled for continued assessment and treatment of the recent onset of atrial fibrillation.  Since her last visit, she has done fine.  She notes some increased daytime somnolence, but no limitation of her ability to perform physical activity; however, her family has insisted that she minimize her level of exertion.  She is doing light housework and shopping without difficulty.  She notes rare palpitations but no lightheadedness nor syncope.  She had no chest discomfort, dyspnea, orthopnea, or PND.  Current Outpatient Prescriptions on File Prior to Visit  Medication Sig Dispense Refill  . alendronate (FOSAMAX) 70 MG tablet Take 70 mg by mouth every 7 (seven) days. Take with a full glass of water on an empty stomach.       . Ascorbic Acid (VITAMIN C PO) Take by mouth daily.        Marland Kitchen aspirin 81 MG tablet Take 81 mg by mouth daily.        . Calcium Carbonate-Vitamin D (CALCIUM + D PO) Take by mouth daily.        . digoxin (LANOXIN) 0.125 MG tablet Take 125 mcg by mouth daily.        Marland Kitchen diltiazem (CARDIZEM CD) 240 MG 24 hr capsule Take 240 mg by mouth daily.        Marland Kitchen levothyroxine (SYNTHROID, LEVOTHROID) 50 MCG tablet Take 50 mcg by mouth daily.        . Multiple Vitamin (MULTI-VITAMIN PO) Take by mouth daily.        Marland Kitchen PARoxetine (PAXIL) 20 MG tablet Take 20 mg by mouth every morning.       . rosuvastatin (CRESTOR) 10 MG tablet Take 20 mg by mouth daily.       Marland Kitchen warfarin (COUMADIN) 4 MG tablet Take 4 mg by mouth as directed.           No Known Allergies    Past medical history, social history, and family history reviewed and updated.  ROS: See history of present illness  PHYSICAL EXAM: BP 118/67  Pulse 80  Ht 5\' 6"  (1.676 m)  Wt 45.36 kg (100 lb)  BMI 16.14 kg/m2 ; weight during adult life has varied from 95-120 pounds. General-Well developed; no acute distress Body habitus-thin Neck-No JVD; no carotid bruits Lungs-clear lung fields; resonant to  percussion; mild kyphosis Cardiovascular-normal PMI; normal S1 and S2; modest early systolic ejection murmur Abdomen-normal bowel sounds; soft and non-tender without masses or organomegaly Musculoskeletal-No deformities, no cyanosis or clubbing Neurologic-Normal cranial nerves; symmetric strength and tone Skin-Warm, no significant lesions Extremities-distal pulses intact; no edema  Rhythm Strip: atrial fibrillation with a rapid ventricular response; ventricular rate is 107 bpm.  ASSESSMENT AND PLAN:

## 2011-05-21 ENCOUNTER — Ambulatory Visit (INDEPENDENT_AMBULATORY_CARE_PROVIDER_SITE_OTHER): Payer: Medicare Other | Admitting: *Deleted

## 2011-05-21 DIAGNOSIS — Z7901 Long term (current) use of anticoagulants: Secondary | ICD-10-CM

## 2011-05-21 DIAGNOSIS — I8290 Acute embolism and thrombosis of unspecified vein: Secondary | ICD-10-CM

## 2011-05-21 DIAGNOSIS — I749 Embolism and thrombosis of unspecified artery: Secondary | ICD-10-CM

## 2011-05-22 MED ORDER — HYDROCORTISONE 1 % EX CREA: 1.0000 "application " | TOPICAL_CREAM | Freq: Three times a day (TID) | CUTANEOUS | Status: DC | PRN

## 2011-05-22 MED ORDER — SODIUM CHLORIDE 0.9 % IV SOLN: 250.0000 mL | INTRAVENOUS | Status: DC

## 2011-05-25 ENCOUNTER — Encounter: Payer: Medicare Other | Admitting: Cardiology

## 2011-05-25 ENCOUNTER — Encounter (HOSPITAL_COMMUNITY)
Admission: RE | Disposition: A | Discharge status: Home / Self Care | Payer: Self-pay | Source: Ambulatory Visit | Attending: Cardiology

## 2011-05-25 ENCOUNTER — Ambulatory Visit (HOSPITAL_COMMUNITY)
Admission: RE | Admit: 2011-05-25 | Discharge: 2011-05-25 | Disposition: A | Discharge status: Home / Self Care | Payer: Medicare Other | Source: Ambulatory Visit | Attending: Cardiology | Admitting: Cardiology

## 2011-05-25 ENCOUNTER — Other Ambulatory Visit: Payer: Self-pay

## 2011-05-25 ENCOUNTER — Encounter (HOSPITAL_COMMUNITY): Payer: Self-pay | Admitting: *Deleted

## 2011-05-25 ENCOUNTER — Ambulatory Visit (INDEPENDENT_AMBULATORY_CARE_PROVIDER_SITE_OTHER): Payer: Medicare Other | Admitting: *Deleted

## 2011-05-25 ENCOUNTER — Ambulatory Visit (HOSPITAL_COMMUNITY): Payer: Medicare Other

## 2011-05-25 ENCOUNTER — Ambulatory Visit: Admit: 2011-05-25 | Payer: Self-pay | Admitting: Cardiology

## 2011-05-25 VITALS — BP 126/67 | HR 75 | Temp 97.7°F | Resp 24

## 2011-05-25 DIAGNOSIS — Z7901 Long term (current) use of anticoagulants: Secondary | ICD-10-CM

## 2011-05-25 DIAGNOSIS — I4891 Unspecified atrial fibrillation: Secondary | ICD-10-CM | POA: Insufficient documentation

## 2011-05-25 DIAGNOSIS — I749 Embolism and thrombosis of unspecified artery: Secondary | ICD-10-CM

## 2011-05-25 DIAGNOSIS — Z7982 Long term (current) use of aspirin: Secondary | ICD-10-CM | POA: Insufficient documentation

## 2011-05-25 DIAGNOSIS — I8290 Acute embolism and thrombosis of unspecified vein: Secondary | ICD-10-CM

## 2011-05-25 HISTORY — PX: CARDIOVERSION: SHX1299

## 2011-05-25 LAB — POCT INR: INR: 2.7

## 2011-05-25 SURGERY — CARDIOVERSION
Anesthesia: Moderate Sedation | Wound class: Clean

## 2011-05-25 SURGERY — PHYSICIAN CASE REQUEST
Anesthesia: Moderate Sedation

## 2011-05-25 MED ORDER — SODIUM CHLORIDE 0.9 % IJ SOLN: 3.0000 mL | Freq: Two times a day (BID) | INTRAMUSCULAR | Status: DC

## 2011-05-25 MED ORDER — MIDAZOLAM HCL 5 MG/5ML IJ SOLN
INTRAMUSCULAR | Status: AC
  Administered 2011-05-25: 4 mg
  Filled 2011-05-25: qty 10

## 2011-05-25 MED ORDER — SODIUM CHLORIDE 0.9 % IV SOLN: 250.0000 mL | INTRAVENOUS | Status: DC

## 2011-05-25 MED ORDER — SODIUM CHLORIDE 0.45 % IV SOLN
INTRAVENOUS | Status: DC
  Administered 2011-05-25: 10:00:00 via INTRAVENOUS

## 2011-05-25 MED ORDER — HYDROCORTISONE 1 % EX CREA: 1.0000 "application " | TOPICAL_CREAM | Freq: Three times a day (TID) | CUTANEOUS | Status: DC | PRN

## 2011-05-25 MED ORDER — SODIUM CHLORIDE 0.9 % IJ SOLN: 3.0000 mL | INTRAMUSCULAR | Status: DC | PRN

## 2011-05-25 NOTE — Progress Notes (Signed)
Pt transferred to short stay post op with no complaints. Report called. Family conferenced with Dr. Dietrich Pates post procedure.

## 2011-05-25 NOTE — H&P (Signed)
I have reviewed the note of an H&P, which was performed by me in the office on 05/14/11.  Assessment today including an interim history and physical examination,  indicates no change in status and that Autumn Powers is stable for procedure.  [place] Bing, M.D. 05/25/2011  12:29 PM

## 2011-05-25 NOTE — Discharge Summary (Signed)
Patient was brought to the PACU where she was sedated with a total of 4 mg of IV midazolam. A single 200 J defibrillator discharge, synchronized to the QRS complexes and delivered via AP pads resulted in restoration of sinus rhythm. Patient remained stable throughout the procedure, and emerged from sedation and was discharged to home in the care of her daughter-in-law. Return office visit is anticipated within the next few weeks.

## 2011-05-25 NOTE — Procedures (Signed)
Procedure Note-Cardioversion Autumn Powers [id number] 1935-07-15  Procedure: DC Cardioversion Indications:  Atrial Fibrillation  Procedure Details Consent: Risks of procedure as well as the alternatives and risks of each were explained to the (patient/caregiver).  Consent for procedure obtained. Time Out: Verified patient identification, verified procedure and site, verified correct patient position, romazicon and intubation equipment available, meds/allergies/history reviewed, theraputic anticoagulation verified.  Performed  Cardiac monitor, pulse oximetry, supplemental oxygen.  Sedation given: midazolam Pad electrodes placed anterior and posterior chest.  Cardioverted 1 time(s).  Cardioverted at 200J.  Evaluation Findings: Post procedure EKG shows: NSR Complications: None Patient did tolerate procedure well.   [name] 05/25/2011, 12:48 PM

## 2011-05-25 NOTE — Progress Notes (Addendum)
1238: Versed 2 mg given. 1239 Synchronized Cardioversion with 200 joules. Pt converted to NSR. 12 lead EKG to be performed.

## 2011-05-31 NOTE — Progress Notes (Signed)
This encounter was created in error - please disregard.

## 2011-06-01 ENCOUNTER — Ambulatory Visit (INDEPENDENT_AMBULATORY_CARE_PROVIDER_SITE_OTHER): Payer: Medicare Other | Admitting: *Deleted

## 2011-06-01 ENCOUNTER — Encounter: Payer: Medicare Other | Admitting: *Deleted

## 2011-06-01 DIAGNOSIS — Z7901 Long term (current) use of anticoagulants: Secondary | ICD-10-CM

## 2011-06-01 DIAGNOSIS — I8290 Acute embolism and thrombosis of unspecified vein: Secondary | ICD-10-CM

## 2011-06-01 DIAGNOSIS — I749 Embolism and thrombosis of unspecified artery: Secondary | ICD-10-CM

## 2011-06-01 LAB — POCT INR: INR: 2.8

## 2011-06-02 ENCOUNTER — Encounter (HOSPITAL_COMMUNITY): Payer: Self-pay | Admitting: Cardiology

## 2011-06-03 ENCOUNTER — Encounter (HOSPITAL_COMMUNITY): Payer: Self-pay

## 2011-06-08 ENCOUNTER — Ambulatory Visit (INDEPENDENT_AMBULATORY_CARE_PROVIDER_SITE_OTHER): Payer: Medicare Other | Admitting: *Deleted

## 2011-06-08 DIAGNOSIS — Z7901 Long term (current) use of anticoagulants: Secondary | ICD-10-CM

## 2011-06-08 DIAGNOSIS — I8290 Acute embolism and thrombosis of unspecified vein: Secondary | ICD-10-CM

## 2011-06-08 DIAGNOSIS — I749 Embolism and thrombosis of unspecified artery: Secondary | ICD-10-CM

## 2011-06-08 LAB — POCT INR: INR: 3.2

## 2011-06-17 ENCOUNTER — Ambulatory Visit (INDEPENDENT_AMBULATORY_CARE_PROVIDER_SITE_OTHER): Payer: Medicare Other | Admitting: *Deleted

## 2011-06-17 DIAGNOSIS — I749 Embolism and thrombosis of unspecified artery: Secondary | ICD-10-CM

## 2011-06-17 DIAGNOSIS — I8290 Acute embolism and thrombosis of unspecified vein: Secondary | ICD-10-CM

## 2011-06-17 DIAGNOSIS — Z7901 Long term (current) use of anticoagulants: Secondary | ICD-10-CM

## 2011-06-23 ENCOUNTER — Encounter: Payer: Self-pay | Admitting: Cardiology

## 2011-06-23 ENCOUNTER — Ambulatory Visit (INDEPENDENT_AMBULATORY_CARE_PROVIDER_SITE_OTHER): Payer: Medicare Other | Admitting: Cardiology

## 2011-06-23 DIAGNOSIS — I4891 Unspecified atrial fibrillation: Secondary | ICD-10-CM

## 2011-06-23 DIAGNOSIS — Z7901 Long term (current) use of anticoagulants: Secondary | ICD-10-CM

## 2011-06-23 MED ORDER — ATENOLOL 50 MG PO TABS: 50.0000 mg | ORAL_TABLET | Freq: Every day | ORAL | Status: DC

## 2011-06-23 NOTE — Assessment & Plan Note (Signed)
Despite recurrence of atrial fibrillation following cardioversion, patient's symptoms are improved.  It appears that a rate control strategy will be appropriate in this nice older woman.  She is currently being treated with 3 rate control medications, but with normal renal function, digoxin at a dose of 0.125 mg per day may not be terribly effective.  Atenolol dosage will be increased to 50 mg per day and digoxin discontinued.  Heart rate control will be monitored at her anticoagulation visits.  A return visit with me is anticipated in 4 months.

## 2011-06-23 NOTE — Assessment & Plan Note (Signed)
Recent colonoscopy was reportedly negative.  Periodic CBCs and stool testing will be performed to exclude occult GI blood loss.

## 2011-06-23 NOTE — Progress Notes (Signed)
Patient ID: Autumn Powers, female   DOB: March 27, 1936, 75 y.o.   MRN: [id number] HPI: Return visit for this very pleasant woman with recent onset of atrial fibrillation.  Uncomplicated DC cardioversion was carried out one month ago.  Since that time, patient has experienced increased energy and now is free of cardiopulmonary complaints.  Prior to Admission medications   Medication Sig Start Date End Date Taking? Authorizing Provider  alendronate (FOSAMAX) 70 MG tablet Take 70 mg by mouth every 7 (seven) days. Take with a full glass of water on an empty stomach.    Yes Historical Provider, MD  Ascorbic Acid (VITAMIN C PO) Take by mouth daily.     Yes Historical Provider, MD  aspirin 81 MG tablet Take 81 mg by mouth daily.     Yes Historical Provider, MD  Calcium Carbonate-Vitamin D (CALCIUM + D PO) Take by mouth daily.     Yes Historical Provider, MD  diltiazem (CARDIZEM CD) 240 MG 24 hr capsule Take 240 mg by mouth daily.     Yes Historical Provider, MD  levothyroxine (SYNTHROID, LEVOTHROID) 50 MCG tablet Take 50 mcg by mouth daily.     Yes Historical Provider, MD  PARoxetine (PAXIL) 20 MG tablet Take 20 mg by mouth every morning.    Yes Historical Provider, MD  rosuvastatin (CRESTOR) 10 MG tablet Take 20 mg by mouth daily.    Yes Historical Provider, MD  warfarin (COUMADIN) 4 MG tablet Take 4-6 mg by mouth daily. On Tues,Thurs,Sat,Sun 1 tablet and Mon,Wed,Fri 1.5 tablets   Yes Historical Provider, MD  Multiple Vitamin (MULTI-VITAMIN PO) Take by mouth daily.      Historical Provider, MD    No Known Allergies    Past medical history, social history, and family history reviewed and updated.  ROS: Denies orthopnea, PND, pedal edema.  Colonoscopy performed a few months ago, reportedly with negative findings.  PHYSICAL EXAM: BP 118/82  Pulse 110  Wt 46.757 kg (103 lb 1.3 oz)  SpO2 97%   General-Well developed; no acute distress Body habitus-Thin Neck-No JVD; no carotid bruits Lungs-clear lung  fields; resonant to percussion; mild kyphosis Cardiovascular-normal PMI; normal S1 and S2; Irregular rhythm-slightly rapid Abdomen-normal bowel sounds; soft and non-tender without masses or organomegaly Musculoskeletal-No deformities, no cyanosis or clubbing Neurologic-Normal cranial nerves; symmetric strength and tone Skin-Warm, no significant lesions Extremities-distal pulses intact; no edema  EKG:  Atrial fibrillation, indeterminate axis, delayed R-wave progression, incomplete right bundle-branch block; ST-T wave abnormalities consistent with LVH, inferolateral ischemia or digoxin effect.  When compared to a previous tracing performed 05/25/11, axis is now indeterminate and delayed R-wave progression is present.  ASSESSMENT AND PLAN:  Oto Bing, MD 06/23/2011 2:20 PM

## 2011-06-23 NOTE — Patient Instructions (Addendum)
Your physician recommends that you schedule a follow-up appointment in: 4 months   Your physician has recommended you make the following change in your medication:  STOP Digoxin INCREASE Atenolol to 50 mg daily   Stools for hemoccult x 3 and return asap

## 2011-06-24 ENCOUNTER — Encounter: Payer: Self-pay | Admitting: Cardiology

## 2011-06-24 ENCOUNTER — Ambulatory Visit (INDEPENDENT_AMBULATORY_CARE_PROVIDER_SITE_OTHER): Payer: Medicare Other | Admitting: *Deleted

## 2011-06-24 DIAGNOSIS — Z7901 Long term (current) use of anticoagulants: Secondary | ICD-10-CM

## 2011-06-24 DIAGNOSIS — I8290 Acute embolism and thrombosis of unspecified vein: Secondary | ICD-10-CM

## 2011-06-24 DIAGNOSIS — I749 Embolism and thrombosis of unspecified artery: Secondary | ICD-10-CM

## 2011-07-01 ENCOUNTER — Encounter: Payer: Self-pay | Admitting: Cardiology

## 2011-07-09 ENCOUNTER — Encounter (INDEPENDENT_AMBULATORY_CARE_PROVIDER_SITE_OTHER): Payer: Medicare Other

## 2011-07-09 DIAGNOSIS — Z7901 Long term (current) use of anticoagulants: Secondary | ICD-10-CM

## 2011-07-14 ENCOUNTER — Other Ambulatory Visit: Payer: Self-pay

## 2011-07-14 DIAGNOSIS — Z7901 Long term (current) use of anticoagulants: Secondary | ICD-10-CM

## 2011-07-14 LAB — POC HEMOCCULT BLD/STL (HOME/3-CARD/SCREEN): Fecal Occult Blood, POC: NEGATIVE

## 2011-07-21 ENCOUNTER — Other Ambulatory Visit: Payer: Self-pay | Admitting: *Deleted

## 2011-07-21 MED ORDER — WARFARIN SODIUM 4 MG PO TABS: 4.0000 mg | ORAL_TABLET | Freq: Every day | ORAL | Status: DC

## 2011-07-22 ENCOUNTER — Ambulatory Visit (INDEPENDENT_AMBULATORY_CARE_PROVIDER_SITE_OTHER): Payer: Medicare Other | Admitting: *Deleted

## 2011-07-22 DIAGNOSIS — I749 Embolism and thrombosis of unspecified artery: Secondary | ICD-10-CM

## 2011-07-22 DIAGNOSIS — I4891 Unspecified atrial fibrillation: Secondary | ICD-10-CM

## 2011-07-22 DIAGNOSIS — I8290 Acute embolism and thrombosis of unspecified vein: Secondary | ICD-10-CM

## 2011-07-22 DIAGNOSIS — Z7901 Long term (current) use of anticoagulants: Secondary | ICD-10-CM

## 2011-07-22 LAB — POCT INR: INR: 2.9

## 2011-08-03 ENCOUNTER — Ambulatory Visit (HOSPITAL_COMMUNITY)
Admission: RE | Admit: 2011-08-03 | Discharge: 2011-08-03 | Disposition: A | Discharge status: Home / Self Care | Payer: Medicare Other | Source: Ambulatory Visit | Attending: Family Medicine | Admitting: Family Medicine

## 2011-08-03 ENCOUNTER — Other Ambulatory Visit (HOSPITAL_COMMUNITY): Payer: Self-pay | Admitting: Family Medicine

## 2011-08-03 DIAGNOSIS — Z139 Encounter for screening, unspecified: Secondary | ICD-10-CM

## 2011-08-03 DIAGNOSIS — Z1231 Encounter for screening mammogram for malignant neoplasm of breast: Secondary | ICD-10-CM | POA: Insufficient documentation

## 2011-08-10 ENCOUNTER — Other Ambulatory Visit: Payer: Self-pay | Admitting: Cardiology

## 2011-08-10 MED ORDER — WARFARIN SODIUM 4 MG PO TABS: 4.0000 mg | ORAL_TABLET | Freq: Every day | ORAL | Status: DC

## 2011-08-19 ENCOUNTER — Ambulatory Visit (INDEPENDENT_AMBULATORY_CARE_PROVIDER_SITE_OTHER): Payer: Medicare Other | Admitting: *Deleted

## 2011-08-19 ENCOUNTER — Encounter: Payer: Medicare Other | Admitting: *Deleted

## 2011-08-19 DIAGNOSIS — I749 Embolism and thrombosis of unspecified artery: Secondary | ICD-10-CM

## 2011-08-19 DIAGNOSIS — I8290 Acute embolism and thrombosis of unspecified vein: Secondary | ICD-10-CM

## 2011-08-19 DIAGNOSIS — I4891 Unspecified atrial fibrillation: Secondary | ICD-10-CM

## 2011-08-19 DIAGNOSIS — Z7901 Long term (current) use of anticoagulants: Secondary | ICD-10-CM

## 2011-09-16 ENCOUNTER — Ambulatory Visit (INDEPENDENT_AMBULATORY_CARE_PROVIDER_SITE_OTHER): Payer: Medicare Other | Admitting: *Deleted

## 2011-09-16 DIAGNOSIS — I749 Embolism and thrombosis of unspecified artery: Secondary | ICD-10-CM

## 2011-09-16 DIAGNOSIS — Z7901 Long term (current) use of anticoagulants: Secondary | ICD-10-CM

## 2011-09-16 DIAGNOSIS — I4891 Unspecified atrial fibrillation: Secondary | ICD-10-CM

## 2011-09-16 DIAGNOSIS — I8290 Acute embolism and thrombosis of unspecified vein: Secondary | ICD-10-CM

## 2011-10-22 ENCOUNTER — Ambulatory Visit (HOSPITAL_COMMUNITY)
Admission: RE | Admit: 2011-10-22 | Discharge: 2011-10-22 | Disposition: A | Discharge status: Home / Self Care | Payer: Medicare Other | Source: Ambulatory Visit | Attending: Cardiology | Admitting: Cardiology

## 2011-10-22 ENCOUNTER — Ambulatory Visit (INDEPENDENT_AMBULATORY_CARE_PROVIDER_SITE_OTHER): Payer: Medicare Other | Admitting: Cardiology

## 2011-10-22 ENCOUNTER — Ambulatory Visit (INDEPENDENT_AMBULATORY_CARE_PROVIDER_SITE_OTHER): Payer: Medicare Other | Admitting: *Deleted

## 2011-10-22 ENCOUNTER — Encounter: Payer: Self-pay | Admitting: Cardiology

## 2011-10-22 VITALS — BP 116/71 | HR 50 | Ht 66.0 in | Wt 110.0 lb

## 2011-10-22 DIAGNOSIS — J449 Chronic obstructive pulmonary disease, unspecified: Secondary | ICD-10-CM | POA: Insufficient documentation

## 2011-10-22 DIAGNOSIS — Z7901 Long term (current) use of anticoagulants: Secondary | ICD-10-CM

## 2011-10-22 DIAGNOSIS — I4891 Unspecified atrial fibrillation: Secondary | ICD-10-CM

## 2011-10-22 DIAGNOSIS — J4489 Other specified chronic obstructive pulmonary disease: Secondary | ICD-10-CM | POA: Insufficient documentation

## 2011-10-22 DIAGNOSIS — R0989 Other specified symptoms and signs involving the circulatory and respiratory systems: Secondary | ICD-10-CM | POA: Insufficient documentation

## 2011-10-22 DIAGNOSIS — E039 Hypothyroidism, unspecified: Secondary | ICD-10-CM

## 2011-10-22 DIAGNOSIS — R0689 Other abnormalities of breathing: Secondary | ICD-10-CM

## 2011-10-22 DIAGNOSIS — I749 Embolism and thrombosis of unspecified artery: Secondary | ICD-10-CM

## 2011-10-22 DIAGNOSIS — I8290 Acute embolism and thrombosis of unspecified vein: Secondary | ICD-10-CM

## 2011-10-22 LAB — CBC
HCT: 44.5 % (ref 36.0–46.0)
MCHC: 32.1 g/dL (ref 30.0–36.0)
RDW: 15.3 % (ref 11.5–15.5)
WBC: 12.6 10*3/uL — ABNORMAL HIGH (ref 4.0–10.5)

## 2011-10-22 LAB — POCT INR: INR: 2.5

## 2011-10-22 NOTE — Patient Instructions (Signed)
Your physician recommends that you schedule a follow-up appointment in: 1 year  Your physician recommends that you return for lab work in: Today  A chest x-ray takes a picture of the organs and structures inside the chest, including the heart, lungs, and blood vessels. This test can show several things, including, whether the heart is enlarges; whether fluid is building up in the lungs; and whether pacemaker / defibrillator leads are still in place.  Take temperature three times a day and with sweats and return to PCP

## 2011-10-22 NOTE — Progress Notes (Deleted)
Name: Autumn Powers    DOB: [birth year]  Age: 76 y.o.  MR#: [id number]       PCP:  Isabella Stalling, MD, MD      Insurance: @PAYORNAME @   CC:    Chief Complaint  Patient presents with  . Appointment    AFIB    VS BP 116/71  Pulse 50  Ht 5\' 6"  (1.676 m)  Wt 110 lb (49.896 kg)  BMI 17.75 kg/m2  Weights Current Weight  10/22/11 110 lb (49.896 kg)  06/23/11 103 lb 1.3 oz (46.757 kg)  05/14/11 100 lb (45.36 kg)    Blood Pressure  BP Readings from Last 3 Encounters:  10/22/11 116/71  06/23/11 118/82  05/25/11 126/67     Admit date:  (Not on file) Last encounter with RMR:  08/10/2011   Allergy No Known Allergies  Current Outpatient Prescriptions  Medication Sig Dispense Refill  . alendronate (FOSAMAX) 70 MG tablet Take 70 mg by mouth every 7 (seven) days. Take with a full glass of water on an empty stomach.       . Ascorbic Acid (VITAMIN C PO) Take by mouth daily.        Marland Kitchen aspirin 81 MG tablet Take 81 mg by mouth daily.        Marland Kitchen atenolol (TENORMIN) 50 MG tablet Take 1 tablet (50 mg total) by mouth daily.  90 tablet  3  . Calcium Carbonate-Vitamin D (CALCIUM + D PO) Take 1 tablet by mouth 2 (two) times daily.       Marland Kitchen diltiazem (CARDIZEM CD) 240 MG 24 hr capsule Take 240 mg by mouth daily.        Marland Kitchen levothyroxine (SYNTHROID, LEVOTHROID) 50 MCG tablet Take 50 mcg by mouth daily.        . Multiple Vitamin (MULTI-VITAMIN PO) Take by mouth daily.        Marland Kitchen PARoxetine (PAXIL) 20 MG tablet Take 20 mg by mouth every morning.       . rosuvastatin (CRESTOR) 10 MG tablet Take 10 mg by mouth daily.       Marland Kitchen warfarin (COUMADIN) 4 MG tablet Take 1 tablet (4 mg total) by mouth daily. On Tues,Thurs,Sat,Sun 1 tablet and Mon,Wed,Fri 1.5 tablets  45 tablet  3   No current facility-administered medications for this visit.   Facility-Administered Medications Ordered in Other Visits  Medication Dose Route Frequency Provider Last Rate Last Dose  . 0.45 % sodium chloride infusion   Intravenous  Continuous Kathlen Brunswick, MD 75 mL/hr at 05/25/11 0950    . hydrocortisone cream 1 % 1 application  1 application Topical TID PRN Kathlen Brunswick, MD      . sodium chloride 0.9 % injection 3 mL  3 mL Intravenous Q12H Kathlen Brunswick, MD      . sodium chloride 0.9 % injection 3 mL  3 mL Intravenous PRN Kathlen Brunswick, MD        Discontinued Meds:   There are no discontinued medications.  Patient Active Problem List  Diagnoses  . Hx of adenomatous colonic polyps  . Chronic anticoagulation  . Atrial fibrillation  . Tobacco abuse, in remission  . Hypothyroidism  . Osteoporosis    LABS Anti-coag visit on 09/16/2011  Component Date Value  . INR 09/16/2011 2.6   Anti-coag visit on 08/19/2011  Component Date Value  . INR 08/19/2011 2.1      Results for this Opt Visit:     Results for  orders placed in visit on 09/16/11  POCT INR      Component Value Range   INR 2.6      EKG Orders placed in visit on 07/01/11  . EKG 12-LEAD     Prior Assessment and Plan Problem List as of 10/22/2011          Cardiology Problems   Atrial fibrillation   Last Assessment & Plan Note   06/23/2011 Office Visit Signed 06/23/2011  3:28 PM by Kathlen Brunswick, MD    Despite recurrence of atrial fibrillation following cardioversion, patient's symptoms are improved.  It appears that a rate control strategy will be appropriate in this nice older woman.  She is currently being treated with 3 rate control medications, but with normal renal function, digoxin at a dose of 0.125 mg per day may not be terribly effective.  Atenolol dosage will be increased to 50 mg per day and digoxin discontinued.  Heart rate control will be monitored at her anticoagulation visits.  A return visit with me is anticipated in 4 months.      Other   Hx of adenomatous colonic polyps   Last Assessment & Plan Note   01/27/2011 Office Visit Signed 01/27/2011  4:04 PM by Nira Retort, NP    76 year old Caucasian female  with hx of adenomatous polyps on colonoscopy in 2007. Due for surveillance now. Denies abdominal pain, change in bowel habits, rectal bleeding, nausea or vomiting. She is quite thin, but she states she fluctuates between 95 and 105 lbs. Has no other concerns and desires to proceed.  Proceed with colonoscopy with Dr. Darrick Penna in the near future. The risks, benefits, and alternatives have been discussed in detail with the patient. She states understanding and desires to proceed.      Chronic anticoagulation   Last Assessment & Plan Note   06/23/2011 Office Visit Signed 06/23/2011  3:30 PM by Kathlen Brunswick, MD    Recent colonoscopy was reportedly negative.  Periodic CBCs and stool testing will be performed to exclude occult GI blood loss.    Tobacco abuse, in remission   Hypothyroidism   Osteoporosis       Imaging: No results found.   FRS Calculation: Score not calculated. Missing: Total Cholesterol

## 2011-10-25 ENCOUNTER — Encounter: Payer: Self-pay | Admitting: Cardiology

## 2011-10-25 NOTE — Progress Notes (Signed)
Patient ID: Autumn Powers, female   DOB: 12/31/1935, 76 y.o.   MRN: [id number]  HPI: Scheduled return visit for this very nice woman with atrial fibrillation.  She is doing well symptomatically and reports no significant health issues at present.  She has not been hospitalized or required urgent medical care.  She performs her usual activities including housework without difficulty.  She does experience frequent diaphoretic spells, typically at night.  She is many years postmenopausal and has not had similar symptoms in the past.  She denies rigors and does not believe that the spells are accompanied by fever.  Prior to Admission medications   Medication Sig Start Date End Date Taking? Authorizing Provider  alendronate (FOSAMAX) 70 MG tablet Take 70 mg by mouth every 7 (seven) days. Take with a full glass of water on an empty stomach.    Yes Historical Provider, MD  Ascorbic Acid (VITAMIN C PO) Take by mouth daily.     Yes Historical Provider, MD  aspirin 81 MG tablet Take 81 mg by mouth daily.     Yes Historical Provider, MD  atenolol (TENORMIN) 50 MG tablet Take 1 tablet (50 mg total) by mouth daily. 06/23/11 06/22/12 Yes Kathlen Brunswick, MD  Calcium Carbonate-Vitamin D (CALCIUM + D PO) Take 1 tablet by mouth 2 (two) times daily.    Yes Historical Provider, MD  diltiazem (CARDIZEM CD) 240 MG 24 hr capsule Take 240 mg by mouth daily.     Yes Historical Provider, MD  levothyroxine (SYNTHROID, LEVOTHROID) 50 MCG tablet Take 50 mcg by mouth daily.     Yes Historical Provider, MD  Multiple Vitamin (MULTI-VITAMIN PO) Take by mouth daily.     Yes Historical Provider, MD  PARoxetine (PAXIL) 20 MG tablet Take 20 mg by mouth every morning.    Yes Historical Provider, MD  rosuvastatin (CRESTOR) 10 MG tablet Take 10 mg by mouth daily.    Yes Historical Provider, MD  warfarin (COUMADIN) 4 MG tablet Take 1 tablet (4 mg total) by mouth daily. On Tues,Thurs,Sat,Sun 1 tablet and Mon,Wed,Fri 1.5 tablets 08/10/11  Yes  Kathlen Brunswick, MD  No Known Allergies    Past medical history, social history, and family history reviewed and updated.  ROS: Denies chest pain, dyspnea, orthopnea, PND, lightheadedness or syncope.  All other systems reviewed and are negative.  PHYSICAL EXAM: BP 116/71  Pulse 50  Ht 5\' 6"  (1.676 m)  Wt 49.896 kg (110 lb)  BMI 17.75 kg/m2   General-Well developed; no acute distress Body habitus-thin Neck-No JVD; no carotid bruits Lungs-clear lung fields; resonant to percussion; decreased breath sounds the left base Cardiovascular-normal PMI; normal S1 and S2 Abdomen-normal bowel sounds; soft and non-tender without masses or organomegaly Musculoskeletal-No deformities, no cyanosis or clubbing Neurologic-Normal cranial nerves; symmetric strength and tone Skin-Warm, no significant lesions Extremities-distal pulses intact; no edema  Rhythm Strip: sinus bradycardia at a rate of 52  ASSESSMENT AND PLAN:  Wellford Bing, MD 10/25/2011 9:41 AM

## 2011-10-25 NOTE — Assessment & Plan Note (Signed)
Diaphoresis probably does not reflect thyroid disease; TSH was normal in 03/2011.  Patient will monitor temperature at home and return those values to be reviewed by her PCP at her next visit.

## 2011-10-25 NOTE — Assessment & Plan Note (Addendum)
Patient is doing well with a rate control strategy and chronic anticoagulation.  Chest x-ray will be obtained to further assess the abnormalities detected on lung examination.

## 2011-10-29 ENCOUNTER — Other Ambulatory Visit: Payer: Self-pay | Admitting: *Deleted

## 2011-10-29 MED ORDER — DILTIAZEM HCL ER COATED BEADS 240 MG PO CP24: 240.0000 mg | ORAL_CAPSULE | Freq: Every day | ORAL | Status: DC

## 2011-12-03 ENCOUNTER — Ambulatory Visit (INDEPENDENT_AMBULATORY_CARE_PROVIDER_SITE_OTHER): Payer: Medicare Other | Admitting: *Deleted

## 2011-12-03 DIAGNOSIS — Z7901 Long term (current) use of anticoagulants: Secondary | ICD-10-CM

## 2011-12-03 DIAGNOSIS — I4891 Unspecified atrial fibrillation: Secondary | ICD-10-CM

## 2011-12-03 DIAGNOSIS — I8290 Acute embolism and thrombosis of unspecified vein: Secondary | ICD-10-CM

## 2011-12-03 DIAGNOSIS — I749 Embolism and thrombosis of unspecified artery: Secondary | ICD-10-CM

## 2011-12-03 LAB — POCT INR: INR: 2.8

## 2012-01-14 ENCOUNTER — Ambulatory Visit (INDEPENDENT_AMBULATORY_CARE_PROVIDER_SITE_OTHER): Payer: Medicare Other | Admitting: *Deleted

## 2012-01-14 DIAGNOSIS — I8290 Acute embolism and thrombosis of unspecified vein: Secondary | ICD-10-CM

## 2012-01-14 DIAGNOSIS — I4891 Unspecified atrial fibrillation: Secondary | ICD-10-CM

## 2012-01-14 DIAGNOSIS — I749 Embolism and thrombosis of unspecified artery: Secondary | ICD-10-CM

## 2012-01-14 DIAGNOSIS — Z7901 Long term (current) use of anticoagulants: Secondary | ICD-10-CM

## 2012-01-14 LAB — POCT INR: INR: 2.4

## 2012-01-14 MED ORDER — WARFARIN SODIUM 4 MG PO TABS: 4.0000 mg | ORAL_TABLET | Freq: Every day | ORAL | Status: DC

## 2012-02-25 ENCOUNTER — Ambulatory Visit (INDEPENDENT_AMBULATORY_CARE_PROVIDER_SITE_OTHER): Payer: Medicare Other | Admitting: *Deleted

## 2012-02-25 DIAGNOSIS — I4891 Unspecified atrial fibrillation: Secondary | ICD-10-CM

## 2012-02-25 DIAGNOSIS — I8290 Acute embolism and thrombosis of unspecified vein: Secondary | ICD-10-CM

## 2012-02-25 DIAGNOSIS — I749 Embolism and thrombosis of unspecified artery: Secondary | ICD-10-CM

## 2012-02-25 DIAGNOSIS — Z7901 Long term (current) use of anticoagulants: Secondary | ICD-10-CM

## 2012-02-25 LAB — POCT INR: INR: 2.4

## 2012-04-07 ENCOUNTER — Ambulatory Visit (INDEPENDENT_AMBULATORY_CARE_PROVIDER_SITE_OTHER): Payer: Medicare Other | Admitting: *Deleted

## 2012-04-07 DIAGNOSIS — I4891 Unspecified atrial fibrillation: Secondary | ICD-10-CM

## 2012-04-07 DIAGNOSIS — I8290 Acute embolism and thrombosis of unspecified vein: Secondary | ICD-10-CM

## 2012-04-07 DIAGNOSIS — Z7901 Long term (current) use of anticoagulants: Secondary | ICD-10-CM

## 2012-04-07 DIAGNOSIS — I749 Embolism and thrombosis of unspecified artery: Secondary | ICD-10-CM

## 2012-04-12 ENCOUNTER — Other Ambulatory Visit: Payer: Self-pay

## 2012-04-12 MED ORDER — DILTIAZEM HCL ER COATED BEADS 240 MG PO CP24: 240.0000 mg | ORAL_CAPSULE | Freq: Every day | ORAL | Status: DC

## 2012-04-12 MED ORDER — ATENOLOL 50 MG PO TABS: 50.0000 mg | ORAL_TABLET | Freq: Every day | ORAL | Status: DC

## 2012-04-15 ENCOUNTER — Other Ambulatory Visit: Payer: Self-pay | Admitting: *Deleted

## 2012-04-15 MED ORDER — WARFARIN SODIUM 4 MG PO TABS: 4.0000 mg | ORAL_TABLET | Freq: Every day | ORAL | Status: DC

## 2012-04-15 MED ORDER — DILTIAZEM HCL ER COATED BEADS 240 MG PO CP24: 240.0000 mg | ORAL_CAPSULE | Freq: Every day | ORAL | Status: DC

## 2012-04-15 MED ORDER — ATENOLOL 50 MG PO TABS: 50.0000 mg | ORAL_TABLET | Freq: Every day | ORAL | Status: DC

## 2012-04-15 NOTE — Telephone Encounter (Signed)
Fax Received. Refill Completed. Viki Carrera Chowoe (R.M.A)   

## 2012-04-15 NOTE — Telephone Encounter (Signed)
Fax Received. Refill Completed. Britten Parady Chowoe (R.M.A)   

## 2012-04-22 ENCOUNTER — Other Ambulatory Visit: Payer: Self-pay | Admitting: Cardiology

## 2012-04-22 MED ORDER — WARFARIN SODIUM 4 MG PO TABS: 135.0000 mg | ORAL_TABLET | Freq: Every day | ORAL | Status: DC

## 2012-05-02 ENCOUNTER — Other Ambulatory Visit: Payer: Self-pay | Admitting: Cardiology

## 2012-05-02 MED ORDER — ATENOLOL 50 MG PO TABS: 50.0000 mg | ORAL_TABLET | Freq: Every day | ORAL | Status: DC

## 2012-05-19 ENCOUNTER — Ambulatory Visit (INDEPENDENT_AMBULATORY_CARE_PROVIDER_SITE_OTHER): Payer: Medicare Other | Admitting: *Deleted

## 2012-05-19 DIAGNOSIS — I749 Embolism and thrombosis of unspecified artery: Secondary | ICD-10-CM

## 2012-05-19 DIAGNOSIS — I4891 Unspecified atrial fibrillation: Secondary | ICD-10-CM

## 2012-05-19 DIAGNOSIS — I8290 Acute embolism and thrombosis of unspecified vein: Secondary | ICD-10-CM

## 2012-05-19 DIAGNOSIS — Z7901 Long term (current) use of anticoagulants: Secondary | ICD-10-CM

## 2012-05-19 LAB — POCT INR: INR: 2.3

## 2012-07-04 ENCOUNTER — Ambulatory Visit (INDEPENDENT_AMBULATORY_CARE_PROVIDER_SITE_OTHER): Payer: Medicare Other | Admitting: *Deleted

## 2012-07-04 DIAGNOSIS — I8290 Acute embolism and thrombosis of unspecified vein: Secondary | ICD-10-CM

## 2012-07-04 DIAGNOSIS — I4891 Unspecified atrial fibrillation: Secondary | ICD-10-CM

## 2012-07-04 DIAGNOSIS — Z7901 Long term (current) use of anticoagulants: Secondary | ICD-10-CM

## 2012-07-04 DIAGNOSIS — I749 Embolism and thrombosis of unspecified artery: Secondary | ICD-10-CM

## 2012-07-07 ENCOUNTER — Other Ambulatory Visit (HOSPITAL_COMMUNITY): Payer: Self-pay | Admitting: Family Medicine

## 2012-07-07 DIAGNOSIS — Z139 Encounter for screening, unspecified: Secondary | ICD-10-CM

## 2012-08-04 ENCOUNTER — Ambulatory Visit (HOSPITAL_COMMUNITY)
Admission: RE | Admit: 2012-08-04 | Discharge: 2012-08-04 | Disposition: A | Discharge status: Home / Self Care | Payer: Medicare Other | Source: Ambulatory Visit | Attending: Family Medicine | Admitting: Family Medicine

## 2012-08-04 DIAGNOSIS — Z139 Encounter for screening, unspecified: Secondary | ICD-10-CM

## 2012-08-04 DIAGNOSIS — Z1231 Encounter for screening mammogram for malignant neoplasm of breast: Secondary | ICD-10-CM | POA: Insufficient documentation

## 2012-08-08 ENCOUNTER — Other Ambulatory Visit: Payer: Self-pay | Admitting: Family Medicine

## 2012-08-08 DIAGNOSIS — R928 Other abnormal and inconclusive findings on diagnostic imaging of breast: Secondary | ICD-10-CM

## 2012-08-15 ENCOUNTER — Ambulatory Visit (INDEPENDENT_AMBULATORY_CARE_PROVIDER_SITE_OTHER): Payer: Medicare Other | Admitting: *Deleted

## 2012-08-15 DIAGNOSIS — I8290 Acute embolism and thrombosis of unspecified vein: Secondary | ICD-10-CM

## 2012-08-15 DIAGNOSIS — Z7901 Long term (current) use of anticoagulants: Secondary | ICD-10-CM

## 2012-08-15 DIAGNOSIS — I4891 Unspecified atrial fibrillation: Secondary | ICD-10-CM

## 2012-08-15 DIAGNOSIS — I749 Embolism and thrombosis of unspecified artery: Secondary | ICD-10-CM

## 2012-08-24 ENCOUNTER — Ambulatory Visit (HOSPITAL_COMMUNITY)
Admission: RE | Admit: 2012-08-24 | Discharge: 2012-08-24 | Disposition: A | Discharge status: Home / Self Care | Payer: Medicare Other | Source: Ambulatory Visit | Attending: Family Medicine | Admitting: Family Medicine

## 2012-08-24 ENCOUNTER — Other Ambulatory Visit: Payer: Self-pay | Admitting: Family Medicine

## 2012-08-24 DIAGNOSIS — R928 Other abnormal and inconclusive findings on diagnostic imaging of breast: Secondary | ICD-10-CM

## 2012-08-25 ENCOUNTER — Other Ambulatory Visit: Payer: Self-pay | Admitting: Cardiology

## 2012-09-15 ENCOUNTER — Other Ambulatory Visit: Payer: Self-pay | Admitting: Cardiology

## 2012-09-15 NOTE — Telephone Encounter (Signed)
rx sent to pharmacy by e-script  

## 2012-09-26 ENCOUNTER — Ambulatory Visit (INDEPENDENT_AMBULATORY_CARE_PROVIDER_SITE_OTHER): Payer: Medicare Other | Admitting: *Deleted

## 2012-09-26 DIAGNOSIS — Z7901 Long term (current) use of anticoagulants: Secondary | ICD-10-CM

## 2012-09-26 DIAGNOSIS — I4891 Unspecified atrial fibrillation: Secondary | ICD-10-CM

## 2012-09-26 DIAGNOSIS — I749 Embolism and thrombosis of unspecified artery: Secondary | ICD-10-CM

## 2012-09-26 DIAGNOSIS — I8290 Acute embolism and thrombosis of unspecified vein: Secondary | ICD-10-CM

## 2012-10-21 ENCOUNTER — Ambulatory Visit: Payer: Medicare Other | Admitting: Cardiology

## 2012-10-24 ENCOUNTER — Ambulatory Visit (INDEPENDENT_AMBULATORY_CARE_PROVIDER_SITE_OTHER): Payer: Medicare Other | Admitting: *Deleted

## 2012-10-24 ENCOUNTER — Encounter: Payer: Self-pay | Admitting: *Deleted

## 2012-10-24 DIAGNOSIS — I749 Embolism and thrombosis of unspecified artery: Secondary | ICD-10-CM

## 2012-10-24 DIAGNOSIS — I8290 Acute embolism and thrombosis of unspecified vein: Secondary | ICD-10-CM

## 2012-10-24 DIAGNOSIS — I4891 Unspecified atrial fibrillation: Secondary | ICD-10-CM

## 2012-10-24 DIAGNOSIS — Z7901 Long term (current) use of anticoagulants: Secondary | ICD-10-CM

## 2012-10-24 LAB — POCT INR: INR: 2.9

## 2012-11-01 ENCOUNTER — Ambulatory Visit (INDEPENDENT_AMBULATORY_CARE_PROVIDER_SITE_OTHER): Payer: Medicare Other | Admitting: Cardiology

## 2012-11-01 ENCOUNTER — Encounter: Payer: Self-pay | Admitting: Cardiology

## 2012-11-01 VITALS — BP 104/74 | HR 71 | Ht 66.0 in | Wt 111.0 lb

## 2012-11-01 DIAGNOSIS — I4891 Unspecified atrial fibrillation: Secondary | ICD-10-CM

## 2012-11-01 DIAGNOSIS — Z7901 Long term (current) use of anticoagulants: Secondary | ICD-10-CM

## 2012-11-01 NOTE — Progress Notes (Deleted)
Name: Autumn Powers    DOB: Jan 17, 1936  Age: 77 y.o.  MR#: [id number]       PCP:  Isabella Stalling, MD      Insurance: Payor: MEDICARE  Plan: MEDICARE PART A AND B  Product Type: *No Product type*    CC:   No chief complaint on file. LIST   VS Filed Vitals:   11/01/12 1415  BP: 104/74  Pulse: 71  Height: 5\' 6"  (1.676 m)  Weight: 111 lb (50.349 kg)  SpO2: 95%    Weights Current Weight  11/01/12 111 lb (50.349 kg)  10/22/11 110 lb (49.896 kg)  06/23/11 103 lb 1.3 oz (46.757 kg)    Blood Pressure  BP Readings from Last 3 Encounters:  11/01/12 104/74  10/22/11 116/71  06/23/11 118/82     Admit date:  (Not on file) Last encounter with RMR:  10/21/2012   Allergy Review of patient's allergies indicates no known allergies.  Current Outpatient Prescriptions  Medication Sig Dispense Refill  . alendronate (FOSAMAX) 70 MG tablet Take 70 mg by mouth every 7 (seven) days. Take with a full glass of water on an empty stomach.       . Ascorbic Acid (VITAMIN C PO) Take by mouth daily.        Marland Kitchen aspirin 81 MG tablet Take 81 mg by mouth daily.        Marland Kitchen atenolol (TENORMIN) 50 MG tablet Take 1 tablet (50 mg total) by mouth daily.  90 tablet  3  . Calcium Carbonate-Vitamin D (CALCIUM + D PO) Take 1 tablet by mouth 2 (two) times daily.       Marland Kitchen CARDIZEM CD 240 MG 24 hr capsule TAKE 1 CAPSULE DAILY  90 capsule  0  . levothyroxine (SYNTHROID, LEVOTHROID) 50 MCG tablet Take 50 mcg by mouth daily.        . Multiple Vitamin (MULTI-VITAMIN PO) Take by mouth daily.        Marland Kitchen PARoxetine (PAXIL) 20 MG tablet Take 20 mg by mouth every morning.       . rosuvastatin (CRESTOR) 10 MG tablet Take 10 mg by mouth daily.       Marland Kitchen warfarin (COUMADIN) 4 MG tablet TAKE 1 TABLET ON TUESDAY, THURSDAY, SATURDAY, AND SUNDAY AND TAKE 1 AND ONE-HALF TABLETS MONDAY, WEDNESDAY, AND FRIDAY  135 tablet  0   No current facility-administered medications for this visit.   Facility-Administered Medications Ordered in Other  Visits  Medication Dose Route Frequency Provider Last Rate Last Dose  . 0.45 % sodium chloride infusion   Intravenous Continuous Kathlen Brunswick, MD 75 mL/hr at 05/25/11 0950    . hydrocortisone cream 1 % 1 application  1 application Topical TID PRN Kathlen Brunswick, MD      . sodium chloride 0.9 % injection 3 mL  3 mL Intravenous Q12H Kathlen Brunswick, MD      . sodium chloride 0.9 % injection 3 mL  3 mL Intravenous PRN Kathlen Brunswick, MD        Discontinued Meds:   There are no discontinued medications.  Patient Active Problem List   Diagnosis Date Noted  . Tobacco abuse, in remission   . Hypothyroidism   . Osteoporosis   . Campath-induced atrial fibrillation 04/22/2011  . Chronic anticoagulation 04/06/2011  . Hx of adenomatous colonic polyps 01/27/2011    LABS    Component Value Date/Time   NA 142 03/31/2011 0600   NA 140 03/30/2011 1625  NA 139 03/30/2011 1510   K 3.7 03/31/2011 0600   K 4.4 03/30/2011 1625   K 4.4 03/30/2011 1510   CL 106 03/31/2011 0600   CL 101 03/30/2011 1625   CL 102 03/30/2011 1510   CO2 29 03/31/2011 0600   CO2 28 03/30/2011 1510   CO2 24 02/05/2011 1528   GLUCOSE 96 03/31/2011 0600   GLUCOSE 88 03/30/2011 1625   GLUCOSE 96 03/30/2011 1510   BUN 9 03/31/2011 0600   BUN 10 03/30/2011 1625   BUN 10 03/30/2011 1510   CREATININE <0.47* 03/31/2011 0600   CREATININE 0.60 03/30/2011 1625   CREATININE 0.47* 03/30/2011 1510   CALCIUM 8.5 03/31/2011 0600   CALCIUM 9.8 03/30/2011 1510   CALCIUM 8.2* 02/05/2011 1528   GFRNONAA NOT CALCULATED 03/31/2011 0600   GFRNONAA >60 03/30/2011 1510   GFRNONAA NOT CALCULATED 02/05/2011 1528   GFRAA NOT CALCULATED 03/31/2011 0600   GFRAA >60 03/30/2011 1510   GFRAA NOT CALCULATED 02/05/2011 1528   CMP     Component Value Date/Time   NA 142 03/31/2011 0600   K 3.7 03/31/2011 0600   CL 106 03/31/2011 0600   CO2 29 03/31/2011 0600   GLUCOSE 96 03/31/2011 0600   BUN 9 03/31/2011 0600   CREATININE <0.47* 03/31/2011 0600   CALCIUM 8.5  03/31/2011 0600   PROT 6.3 03/31/2011 0600   ALBUMIN 3.5 03/31/2011 0600   AST 48* 03/31/2011 0600   ALT 41* 03/31/2011 0600   ALKPHOS 77 03/31/2011 0600   BILITOT 0.5 03/31/2011 0600   GFRNONAA NOT CALCULATED 03/31/2011 0600   GFRAA NOT CALCULATED 03/31/2011 0600       Component Value Date/Time   WBC 12.6* 10/22/2011 1140   WBC 8.9 04/03/2011 0555   WBC 9.8 04/02/2011 0600   HGB 14.3 10/22/2011 1140   HGB 12.9 04/03/2011 0555   HGB 12.1 04/02/2011 0600   HCT 44.5 10/22/2011 1140   HCT 39.3 04/03/2011 0555   HCT 37.0 04/02/2011 0600   MCV 95.9 10/22/2011 1140   MCV 90.1 04/03/2011 0555   MCV 89.6 04/02/2011 0600    Lipid Panel     Component Value Date/Time   CHOL 122 03/31/2011 0600   TRIG 61 03/31/2011 0600   HDL 53 03/31/2011 0600   CHOLHDL 2.3 03/31/2011 0600   VLDL 12 03/31/2011 0600   LDLCALC 57 03/31/2011 0600    ABG    Component Value Date/Time   TCO2 28 03/30/2011 1625     Lab Results  Component Value Date   TSH 2.780 03/30/2011   BNP (last 3 results) No results found for this basename: PROBNP,  in the last 8760 hours Cardiac Panel (last 3 results) No results found for this basename: CKTOTAL, CKMB, TROPONINI, RELINDX,  in the last 72 hours  Iron/TIBC/Ferritin No results found for this basename: iron, tibc, ferritin     EKG Orders placed in visit on 07/01/11  . EKG 12-LEAD     Prior Assessment and Plan Problem List as of 11/01/2012     ICD-9-CM   Hx of adenomatous colonic polyps   Last Assessment & Plan   01/27/2011 Office Visit Written 01/27/2011  4:04 PM by Nira Retort, NP     77 year old Caucasian female with hx of adenomatous polyps on colonoscopy in 2007. Due for surveillance now. Denies abdominal pain, change in bowel habits, rectal bleeding, nausea or vomiting. She is quite thin, but she states she fluctuates between 95 and 105 lbs. Has no other  concerns and desires to proceed.  Proceed with colonoscopy with Dr. Darrick Penna in the near future. The risks, benefits, and  alternatives have been discussed in detail with the patient. She states understanding and desires to proceed.      Chronic anticoagulation   Last Assessment & Plan   06/23/2011 Office Visit Written 06/23/2011  3:30 PM by Kathlen Brunswick, MD     Recent colonoscopy was reportedly negative.  Periodic CBCs and stool testing will be performed to exclude occult GI blood loss.    Campath-induced atrial fibrillation   Last Assessment & Plan   10/22/2011 Office Visit Edited 10/25/2011  9:53 AM by Kathlen Brunswick, MD     Patient is doing well with a rate control strategy and chronic anticoagulation.  Chest x-ray will be obtained to further assess the abnormalities detected on lung examination.    Tobacco abuse, in remission   Hypothyroidism   Last Assessment & Plan   10/22/2011 Office Visit Written 10/25/2011  9:53 AM by Kathlen Brunswick, MD     Diaphoresis probably does not reflect thyroid disease; TSH was normal in 03/2011.  Patient will monitor temperature at home and return those values to be reviewed by her PCP at her next visit.    Osteoporosis       Imaging: No results found.

## 2012-11-01 NOTE — Progress Notes (Signed)
Patient ID: Autumn Powers, female   DOB: 04-19-1936, 77 y.o.   MRN: [id number]  HPI: Schedule return visit for this very pleasant woman with atrial fibrillation requiring anticoagulation. Since her last visit, she is doing quite well. She remains active including doing all of her housework and some yard work without difficulty. She has had no adverse effects of long-term anticoagulation. She reports no new medical problems nor has she required urgent medical care.  She provides a list of multiple readings from a home sphygmomanometer revealing intermittent low blood pressures in the mid and upper 70s with good control of heart rate and no symptoms.  Current Outpatient Prescriptions  Medication Sig Dispense Refill  . alendronate (FOSAMAX) 70 MG tablet Take 70 mg by mouth every 7 (seven) days. Take with a full glass of water on an empty stomach.       . Ascorbic Acid (VITAMIN C PO) Take by mouth daily.        Marland Kitchen aspirin 81 MG tablet Take 81 mg by mouth daily.        Marland Kitchen atenolol (TENORMIN) 50 MG tablet Take 1 tablet (50 mg total) by mouth daily.  90 tablet  3  . Calcium Carbonate-Vitamin D (CALCIUM + D PO) Take 1 tablet by mouth 2 (two) times daily.       Marland Kitchen CARDIZEM CD 240 MG 24 hr capsule TAKE 1 CAPSULE DAILY  90 capsule  0  . levothyroxine (SYNTHROID, LEVOTHROID) 50 MCG tablet Take 50 mcg by mouth daily.        . Multiple Vitamin (MULTI-VITAMIN PO) Take by mouth daily.        Marland Kitchen PARoxetine (PAXIL) 20 MG tablet Take 20 mg by mouth every morning.       . rosuvastatin (CRESTOR) 10 MG tablet Take 10 mg by mouth daily.       Marland Kitchen warfarin (COUMADIN) 4 MG tablet TAKE 1 TABLET ON TUESDAY, THURSDAY, SATURDAY, AND SUNDAY AND TAKE 1 AND ONE-HALF TABLETS MONDAY, WEDNESDAY, AND FRIDAY  135 tablet  0   No Known Allergies   Past medical history, social history, and family history reviewed and updated.  ROS: Denies chest pain, dyspnea, orthopnea, pedal edema, syncope, hematochezia, hematemesis. She required a followup  ultrasound after recent mammogram, but benign results were reported to her.  All other systems reviewed and are negative.  PHYSICAL EXAM: BP 104/74  Pulse 71  Ht 5\' 6"  (1.676 m)  Wt 50.349 kg (111 lb)  BMI 17.92 kg/m2  SpO2 95%;  Body mass index is 17.92 kg/(m^2). General-Well developed; no acute distress Body habitus-proportionate weight and height Neck-No JVD; no carotid bruits Lungs-clear lung fields; resonant to percussion Cardiovascular-normal PMI; normal S1 and S2; irregular and rapid rhythm Abdomen-normal bowel sounds; soft and non-tender without masses or organomegaly Musculoskeletal-No deformities, no cyanosis or clubbing Neurologic-Normal cranial nerves; symmetric strength and tone Skin-Warm, no significant lesions Extremities-distal pulses intact; no edema  [name], MD 11/01/2012  2:48 PM  ASSESSMENT AND PLAN

## 2012-11-01 NOTE — Assessment & Plan Note (Signed)
No problems to date with chronic warfarin therapy. Recent INRs have been stable and therapeutic. We will continue to monitor stool Hemoccults and CBCs to assess for possible occult GI blood loss.

## 2012-11-01 NOTE — Patient Instructions (Addendum)
Your physician recommends that you schedule a follow-up appointment in:ONE YEAR  Your physician has recommended that you wear a holter monitor. Holter monitors are medical devices that record the heart's electrical activity. Doctors most often use these monitors to diagnose arrhythmias. Arrhythmias are problems with the speed or rhythm of the heartbeat. The monitor is a small, portable device. You can wear one while you do your normal daily activities. This is usually used to diagnose what is causing palpitations/syncope (passing out).FOR 24 HOURS  Your physician has recommended you make the following change in your medication:   1) STOP ASPIRIN  YOUR PHYSICIAN RECOMMENDS THAT YOU COMPLETE THE HEMOCCULT CARDS, BRING BACK TO OUR OFFICE ONCE COMPLETED

## 2012-11-01 NOTE — Assessment & Plan Note (Addendum)
Heart rate is in adequately controlled at this visit with an apical rate of approximately 108. A 24-hour Holter monitor will be provided to assess the adequacy of rate control.  Recent laboratory reports and office records requested from patient's PCP.

## 2012-11-07 ENCOUNTER — Ambulatory Visit (HOSPITAL_COMMUNITY)
Admission: RE | Admit: 2012-11-07 | Discharge: 2012-11-07 | Disposition: A | Discharge status: Home / Self Care | Payer: Medicare Other | Source: Ambulatory Visit | Attending: Cardiology | Admitting: Cardiology

## 2012-11-07 ENCOUNTER — Ambulatory Visit (INDEPENDENT_AMBULATORY_CARE_PROVIDER_SITE_OTHER): Payer: Medicare Other | Admitting: *Deleted

## 2012-11-07 DIAGNOSIS — Z7901 Long term (current) use of anticoagulants: Secondary | ICD-10-CM

## 2012-11-07 DIAGNOSIS — I4891 Unspecified atrial fibrillation: Secondary | ICD-10-CM

## 2012-11-07 NOTE — Progress Notes (Signed)
*  PRELIMINARY RESULTS* Echocardiogram 24H Holter monitor has been performed.  Conrad Flowella 11/07/2012, 10:53 AM

## 2012-11-09 DIAGNOSIS — Z7901 Long term (current) use of anticoagulants: Secondary | ICD-10-CM

## 2012-11-09 LAB — POC HEMOCCULT BLD/STL (HOME/3-CARD/SCREEN)
Card #2 Fecal Occult Blod, POC: NEGATIVE
Fecal Occult Blood, POC: NEGATIVE

## 2012-11-10 NOTE — Procedures (Signed)
Autumn Powers, Autumn Powers                 ACCOUNT NO.:  [id number]  MEDICAL RECORD NO.:  [id number]  LOCATION:  CARDIOPU                      FACILITY:  [facility]  PHYSICIAN:  Gerrit Friends. Dietrich Pates, MD, FACCDATE OF BIRTH:  [birth year]  DATE OF PROCEDURE:  11/07/2012 DATE OF DISCHARGE:  11/07/2012                               HOLTER MONITOR   INDICATION:  A 77 year old woman with atrial fibrillation. 1. Continuous electrocardiographic recording was maintained for 23     hours and 48 minutes during which the predominant rhythm was atrial     fibrillation with a controlled ventricular response.  Over the     entire interval, the mean heart rate was 74 BPM with a range of     60-133.  No pauses in excess of 2.4 seconds were identified. 2. A single PVC was documented during the entire tracing.  No     Arrhythmia, other than patient's baseline atrial fibrillation, was     apparent. 3. ST-segment depression was present at baseline in 1 of the 3     recorded leads and varied throughout the recording based upon heart     rate. 4. Two episodes of dyspnea were identified on the activity diary.  During the first, heart     rate was somewhat above average ranging from 100-133 bpm.  During     the 2nd episode, heart rate appeared to be closer to the overall mean.  IMPRESSION:  Unremarkable continuous electrocardiographic recording documenting the patient's atrial fibrillation, which was constant throughout the 24 hour interval with generally good control of heart rate.  No definite correlation between symptoms and rhythm.  Other findings as noted.     Gerrit Friends. Dietrich Pates, MD, [facility], [place]     RMR/MEDQ  D:  11/10/2012  T:  11/10/2012  Job:  [id number]

## 2012-11-11 ENCOUNTER — Encounter: Payer: Self-pay | Admitting: Cardiology

## 2012-11-14 ENCOUNTER — Ambulatory Visit (INDEPENDENT_AMBULATORY_CARE_PROVIDER_SITE_OTHER): Payer: Medicare Other | Admitting: *Deleted

## 2012-11-14 DIAGNOSIS — I4891 Unspecified atrial fibrillation: Secondary | ICD-10-CM

## 2012-11-14 DIAGNOSIS — Z7901 Long term (current) use of anticoagulants: Secondary | ICD-10-CM

## 2012-11-14 DIAGNOSIS — I8290 Acute embolism and thrombosis of unspecified vein: Secondary | ICD-10-CM

## 2012-11-14 DIAGNOSIS — I749 Embolism and thrombosis of unspecified artery: Secondary | ICD-10-CM

## 2012-11-30 ENCOUNTER — Telehealth: Payer: Self-pay | Admitting: *Deleted

## 2012-11-30 NOTE — Telephone Encounter (Signed)
Please clarify instructions noted on Holter report

## 2012-11-30 NOTE — Telephone Encounter (Signed)
PT IS CALLING FOR 48 HR HOLTER MONITOR RESULTS. STATES IT WAS DONE AROUND 11/07/12/TMJ

## 2012-12-02 NOTE — Telephone Encounter (Signed)
Called pt to advise:  IMPRESSION: Unremarkable continuous electrocardiographic recording  documenting the patient's atrial fibrillation, which was constant  throughout the 24 hour interval with generally good control of heart  rate. No definite correlation between symptoms and rhythm. Other  findings as noted.  Gerrit Friends. Dietrich Pates, MD, [facility]  RR did not make any changes or further instructions, pt understood

## 2012-12-02 NOTE — Telephone Encounter (Signed)
Dictated on 11/10/2012. Report is in Notes section.

## 2012-12-12 ENCOUNTER — Ambulatory Visit (INDEPENDENT_AMBULATORY_CARE_PROVIDER_SITE_OTHER): Payer: Medicare Other | Admitting: *Deleted

## 2012-12-12 DIAGNOSIS — Z7901 Long term (current) use of anticoagulants: Secondary | ICD-10-CM

## 2012-12-12 DIAGNOSIS — I4891 Unspecified atrial fibrillation: Secondary | ICD-10-CM

## 2012-12-12 DIAGNOSIS — I749 Embolism and thrombosis of unspecified artery: Secondary | ICD-10-CM

## 2012-12-12 DIAGNOSIS — I8290 Acute embolism and thrombosis of unspecified vein: Secondary | ICD-10-CM

## 2012-12-12 LAB — POCT INR: INR: 3.1

## 2012-12-24 ENCOUNTER — Other Ambulatory Visit: Payer: Self-pay | Admitting: Cardiology

## 2012-12-26 NOTE — Telephone Encounter (Signed)
Medication sent via escribe.  

## 2013-01-09 ENCOUNTER — Ambulatory Visit (INDEPENDENT_AMBULATORY_CARE_PROVIDER_SITE_OTHER): Payer: Medicare Other | Admitting: *Deleted

## 2013-01-09 DIAGNOSIS — Z7901 Long term (current) use of anticoagulants: Secondary | ICD-10-CM

## 2013-01-09 DIAGNOSIS — I749 Embolism and thrombosis of unspecified artery: Secondary | ICD-10-CM

## 2013-01-09 DIAGNOSIS — I4891 Unspecified atrial fibrillation: Secondary | ICD-10-CM

## 2013-01-09 DIAGNOSIS — I8290 Acute embolism and thrombosis of unspecified vein: Secondary | ICD-10-CM

## 2013-01-16 ENCOUNTER — Telehealth: Payer: Self-pay | Admitting: Cardiology

## 2013-01-16 NOTE — Telephone Encounter (Signed)
Patient needs refill on Warfarin sent to [facility] / [facility]

## 2013-01-17 ENCOUNTER — Other Ambulatory Visit: Payer: Self-pay | Admitting: *Deleted

## 2013-01-17 MED ORDER — WARFARIN SODIUM 4 MG PO TABS: ORAL_TABLET | ORAL | Status: DC

## 2013-01-17 NOTE — Telephone Encounter (Signed)
Medication sent via escribe for coumadin  

## 2013-01-21 IMAGING — CR DG CHEST 2V
2 series · 2 of 2 positions shown · non-contrast
Comparison: 03/30/2011

CLINICAL DATA: Decreased breath sounds left base

CHEST - 2 VIEW

[view not recorded (1 of 2)]
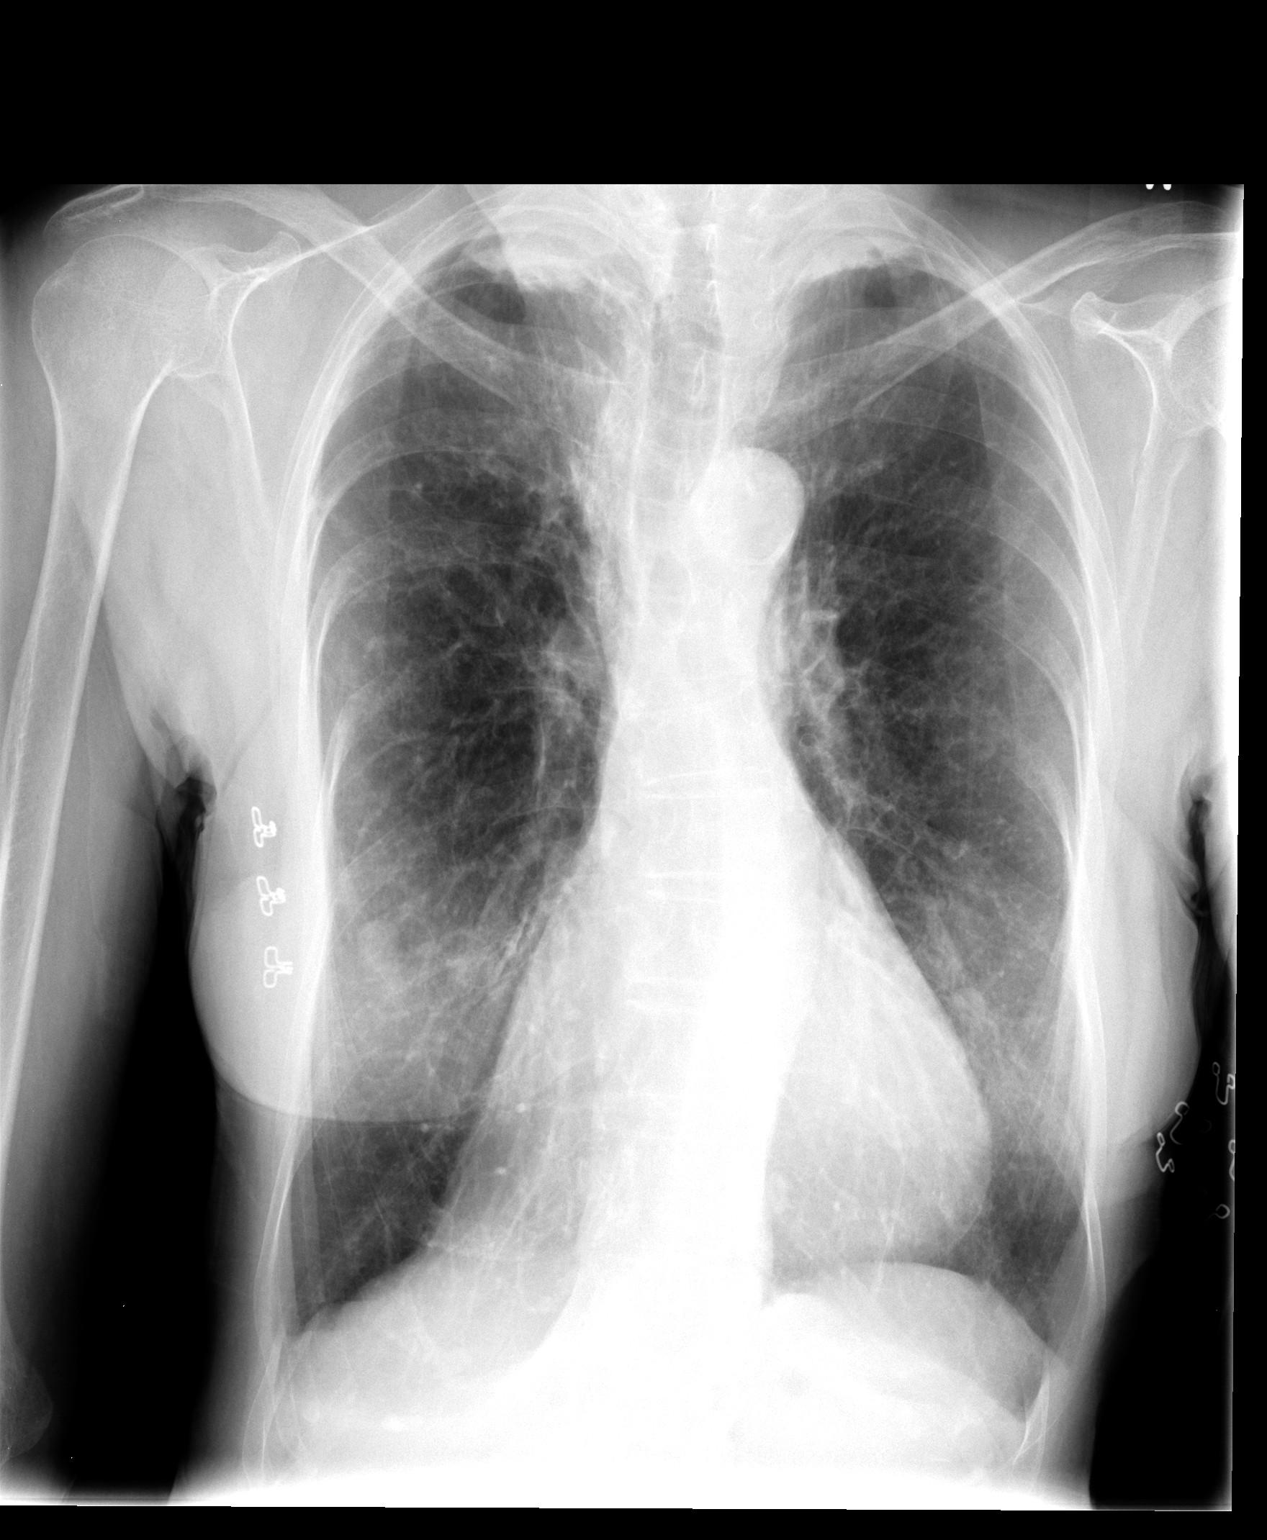

[view not recorded (2 of 2)]
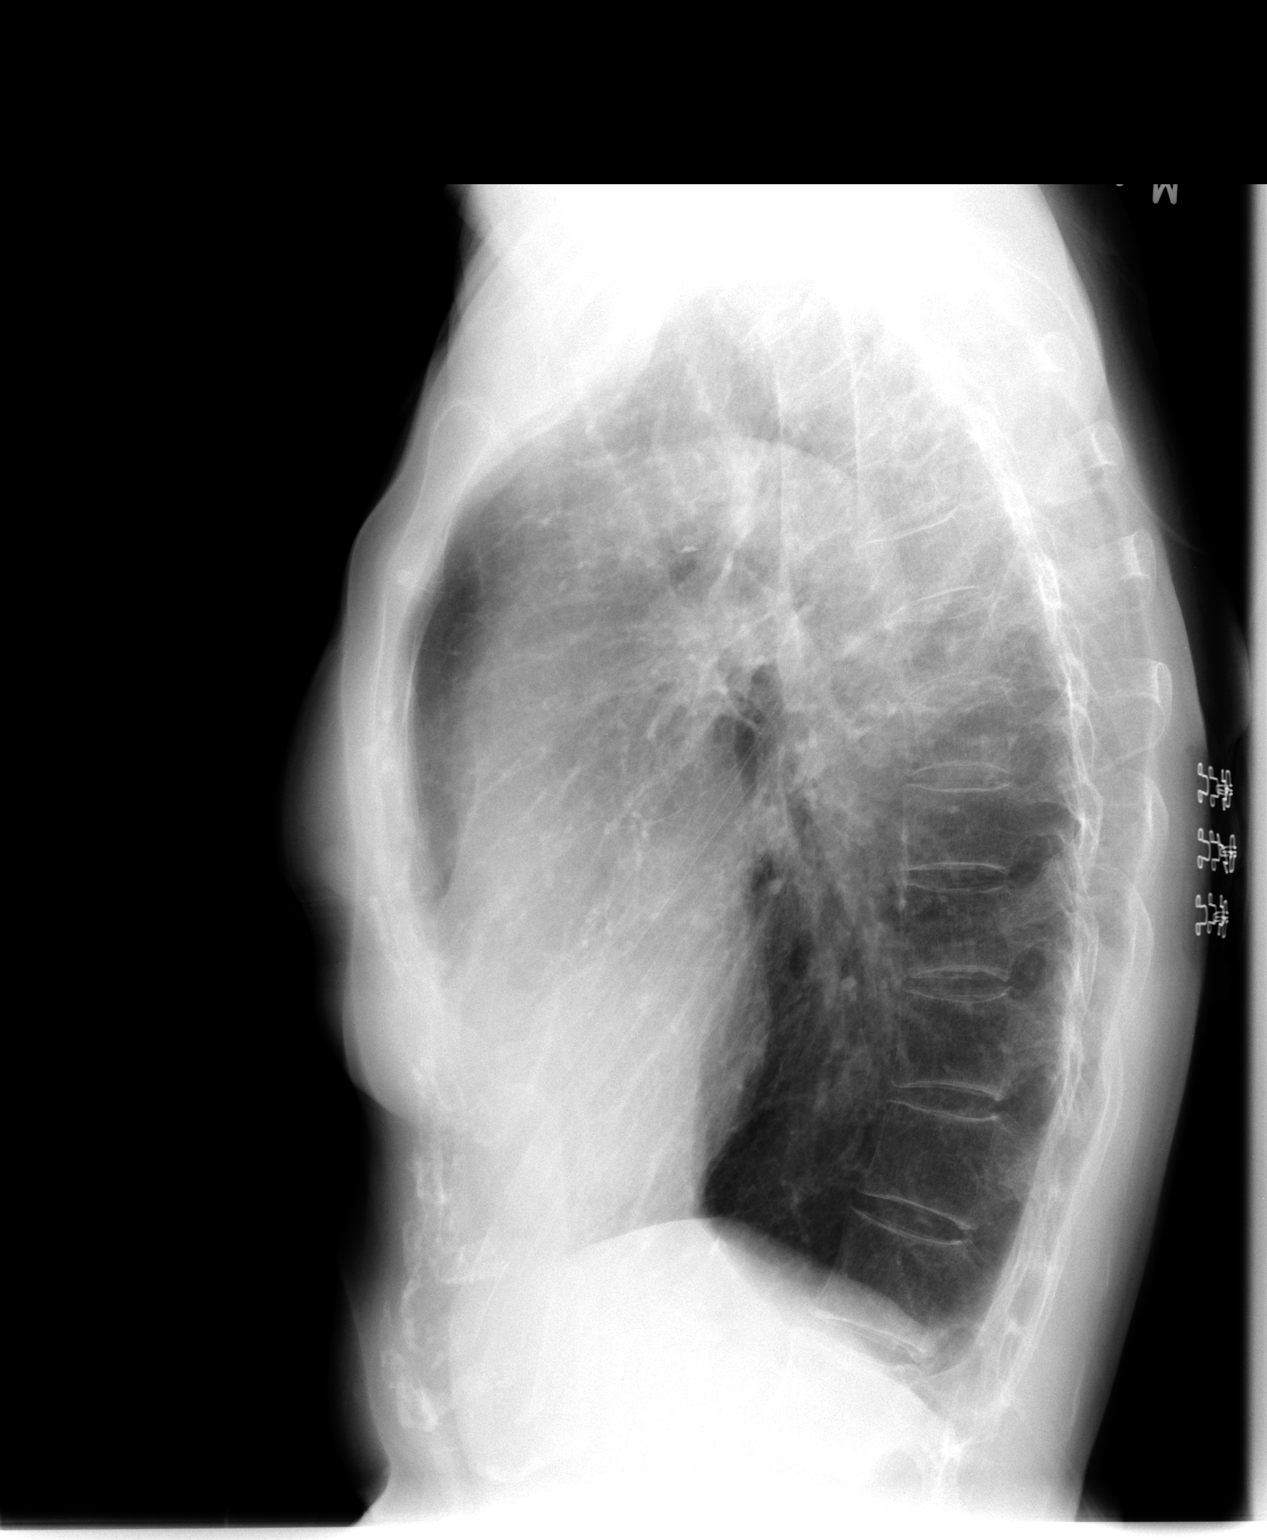

[2 of 2 positions shown; findings below may reference images not displayed]

FINDINGS: Cardiomediastinal silhouette is stable.  Hyperinflation
again noted.  Thoracic spine osteopenia again noted.  No acute
infiltrate or pulmonary edema.  Bilateral apical pleuroparenchymal
scarring is stable.  Bilateral nodular nipple shadow again noted.
IMPRESSION: No active disease.  Stable COPD.

## 2013-02-20 ENCOUNTER — Ambulatory Visit (INDEPENDENT_AMBULATORY_CARE_PROVIDER_SITE_OTHER): Payer: Medicare Other | Admitting: *Deleted

## 2013-02-20 DIAGNOSIS — I749 Embolism and thrombosis of unspecified artery: Secondary | ICD-10-CM

## 2013-02-20 DIAGNOSIS — I8290 Acute embolism and thrombosis of unspecified vein: Secondary | ICD-10-CM

## 2013-02-20 DIAGNOSIS — Z7901 Long term (current) use of anticoagulants: Secondary | ICD-10-CM

## 2013-02-20 DIAGNOSIS — I4891 Unspecified atrial fibrillation: Secondary | ICD-10-CM

## 2013-03-07 ENCOUNTER — Other Ambulatory Visit: Payer: Self-pay | Admitting: Cardiology

## 2013-03-07 ENCOUNTER — Other Ambulatory Visit: Payer: Self-pay

## 2013-04-05 ENCOUNTER — Ambulatory Visit (INDEPENDENT_AMBULATORY_CARE_PROVIDER_SITE_OTHER): Payer: Medicare Other | Admitting: *Deleted

## 2013-04-05 DIAGNOSIS — I8290 Acute embolism and thrombosis of unspecified vein: Secondary | ICD-10-CM

## 2013-04-05 DIAGNOSIS — Z7901 Long term (current) use of anticoagulants: Secondary | ICD-10-CM

## 2013-04-05 DIAGNOSIS — I4891 Unspecified atrial fibrillation: Secondary | ICD-10-CM

## 2013-04-05 DIAGNOSIS — I749 Embolism and thrombosis of unspecified artery: Secondary | ICD-10-CM

## 2013-04-05 LAB — POCT INR: INR: 2.9

## 2013-04-27 ENCOUNTER — Other Ambulatory Visit: Payer: Self-pay | Admitting: Cardiology

## 2013-05-08 ENCOUNTER — Telehealth: Payer: Self-pay | Admitting: *Deleted

## 2013-05-08 MED ORDER — WARFARIN SODIUM 4 MG PO TABS: ORAL_TABLET | ORAL | Status: DC

## 2013-05-08 NOTE — Telephone Encounter (Signed)
PT NEEDS RX CALLED IN FOR WARFARIN 1 WEEK SUPPLY TO [facility] AND 1 MONTH TO [facility]

## 2013-05-08 NOTE — Telephone Encounter (Signed)
Medication sent via escribe.  

## 2013-05-17 ENCOUNTER — Ambulatory Visit (INDEPENDENT_AMBULATORY_CARE_PROVIDER_SITE_OTHER): Payer: Medicare Other | Admitting: *Deleted

## 2013-05-17 DIAGNOSIS — Z7901 Long term (current) use of anticoagulants: Secondary | ICD-10-CM

## 2013-05-17 DIAGNOSIS — I749 Embolism and thrombosis of unspecified artery: Secondary | ICD-10-CM

## 2013-05-17 DIAGNOSIS — I8290 Acute embolism and thrombosis of unspecified vein: Secondary | ICD-10-CM

## 2013-05-17 DIAGNOSIS — I4891 Unspecified atrial fibrillation: Secondary | ICD-10-CM

## 2013-05-17 LAB — POCT INR: INR: 2.4

## 2013-05-18 ENCOUNTER — Other Ambulatory Visit: Payer: Self-pay | Admitting: Cardiovascular Disease

## 2013-05-25 ENCOUNTER — Other Ambulatory Visit: Payer: Self-pay | Admitting: *Deleted

## 2013-05-25 MED ORDER — WARFARIN SODIUM 4 MG PO TABS: ORAL_TABLET | ORAL | Status: DC

## 2013-05-25 NOTE — Telephone Encounter (Signed)
Incoming fax of patient medication refill request noted per patient pharmacy. For [facility]madin [facility]

## 2013-07-03 ENCOUNTER — Ambulatory Visit (INDEPENDENT_AMBULATORY_CARE_PROVIDER_SITE_OTHER): Payer: Medicare Other | Admitting: *Deleted

## 2013-07-03 DIAGNOSIS — I749 Embolism and thrombosis of unspecified artery: Secondary | ICD-10-CM

## 2013-07-03 DIAGNOSIS — I8290 Acute embolism and thrombosis of unspecified vein: Secondary | ICD-10-CM

## 2013-07-03 DIAGNOSIS — I4891 Unspecified atrial fibrillation: Secondary | ICD-10-CM

## 2013-07-03 DIAGNOSIS — Z7901 Long term (current) use of anticoagulants: Secondary | ICD-10-CM

## 2013-07-03 LAB — POCT INR: INR: 2.6

## 2013-08-11 ENCOUNTER — Other Ambulatory Visit (HOSPITAL_COMMUNITY): Payer: Self-pay | Admitting: Family Medicine

## 2013-08-11 DIAGNOSIS — Z139 Encounter for screening, unspecified: Secondary | ICD-10-CM

## 2013-08-14 ENCOUNTER — Other Ambulatory Visit (HOSPITAL_COMMUNITY): Payer: Self-pay | Admitting: Family Medicine

## 2013-08-14 DIAGNOSIS — Z139 Encounter for screening, unspecified: Secondary | ICD-10-CM

## 2013-08-16 ENCOUNTER — Ambulatory Visit (INDEPENDENT_AMBULATORY_CARE_PROVIDER_SITE_OTHER): Payer: Medicare Other | Admitting: *Deleted

## 2013-08-16 DIAGNOSIS — I4891 Unspecified atrial fibrillation: Secondary | ICD-10-CM

## 2013-08-16 DIAGNOSIS — I749 Embolism and thrombosis of unspecified artery: Secondary | ICD-10-CM

## 2013-08-16 DIAGNOSIS — Z7901 Long term (current) use of anticoagulants: Secondary | ICD-10-CM

## 2013-08-16 DIAGNOSIS — Z5181 Encounter for therapeutic drug level monitoring: Secondary | ICD-10-CM

## 2013-08-16 DIAGNOSIS — I8290 Acute embolism and thrombosis of unspecified vein: Secondary | ICD-10-CM

## 2013-08-16 LAB — POCT INR: INR: 3.3

## 2013-08-29 ENCOUNTER — Ambulatory Visit (HOSPITAL_COMMUNITY): Payer: Medicare Other

## 2013-09-05 ENCOUNTER — Ambulatory Visit (HOSPITAL_COMMUNITY)
Admission: RE | Admit: 2013-09-05 | Discharge: 2013-09-05 | Disposition: A | Discharge status: Home / Self Care | Payer: Medicare Other | Source: Ambulatory Visit | Attending: Family Medicine | Admitting: Family Medicine

## 2013-09-05 DIAGNOSIS — Z139 Encounter for screening, unspecified: Secondary | ICD-10-CM

## 2013-09-05 DIAGNOSIS — Z1231 Encounter for screening mammogram for malignant neoplasm of breast: Secondary | ICD-10-CM | POA: Insufficient documentation

## 2013-09-13 ENCOUNTER — Ambulatory Visit (INDEPENDENT_AMBULATORY_CARE_PROVIDER_SITE_OTHER): Payer: Medicare Other | Admitting: *Deleted

## 2013-09-13 DIAGNOSIS — I749 Embolism and thrombosis of unspecified artery: Secondary | ICD-10-CM

## 2013-09-13 DIAGNOSIS — Z5181 Encounter for therapeutic drug level monitoring: Secondary | ICD-10-CM

## 2013-09-13 DIAGNOSIS — Z7901 Long term (current) use of anticoagulants: Secondary | ICD-10-CM

## 2013-09-13 DIAGNOSIS — I4891 Unspecified atrial fibrillation: Secondary | ICD-10-CM

## 2013-09-13 DIAGNOSIS — I8290 Acute embolism and thrombosis of unspecified vein: Secondary | ICD-10-CM

## 2013-09-13 LAB — POCT INR: INR: 2.5

## 2013-10-11 ENCOUNTER — Ambulatory Visit (INDEPENDENT_AMBULATORY_CARE_PROVIDER_SITE_OTHER): Payer: Medicare Other | Admitting: *Deleted

## 2013-10-11 DIAGNOSIS — Z5181 Encounter for therapeutic drug level monitoring: Secondary | ICD-10-CM

## 2013-10-11 DIAGNOSIS — I749 Embolism and thrombosis of unspecified artery: Secondary | ICD-10-CM

## 2013-10-11 DIAGNOSIS — I4891 Unspecified atrial fibrillation: Secondary | ICD-10-CM

## 2013-10-11 DIAGNOSIS — I8290 Acute embolism and thrombosis of unspecified vein: Secondary | ICD-10-CM

## 2013-10-11 DIAGNOSIS — Z7901 Long term (current) use of anticoagulants: Secondary | ICD-10-CM

## 2013-10-11 LAB — POCT INR: INR: 2.2

## 2013-11-09 ENCOUNTER — Ambulatory Visit (INDEPENDENT_AMBULATORY_CARE_PROVIDER_SITE_OTHER): Payer: Medicare Other | Admitting: *Deleted

## 2013-11-09 DIAGNOSIS — I4891 Unspecified atrial fibrillation: Secondary | ICD-10-CM

## 2013-11-09 DIAGNOSIS — I8290 Acute embolism and thrombosis of unspecified vein: Secondary | ICD-10-CM

## 2013-11-09 DIAGNOSIS — I749 Embolism and thrombosis of unspecified artery: Secondary | ICD-10-CM

## 2013-11-09 DIAGNOSIS — Z7901 Long term (current) use of anticoagulants: Secondary | ICD-10-CM

## 2013-11-09 DIAGNOSIS — Z5181 Encounter for therapeutic drug level monitoring: Secondary | ICD-10-CM

## 2013-11-09 LAB — POCT INR: INR: 2.1

## 2013-11-09 MED ORDER — WARFARIN SODIUM 4 MG PO TABS
ORAL_TABLET | ORAL | Status: DC
Start: 2013-11-09 — End: 2014-09-28

## 2013-11-14 ENCOUNTER — Encounter: Payer: Self-pay | Admitting: Cardiovascular Disease

## 2013-11-14 ENCOUNTER — Ambulatory Visit (INDEPENDENT_AMBULATORY_CARE_PROVIDER_SITE_OTHER): Payer: Medicare Other | Admitting: Cardiovascular Disease

## 2013-11-14 VITALS — BP 104/54 | HR 67 | Ht 66.0 in | Wt 124.0 lb

## 2013-11-14 DIAGNOSIS — Z7182 Exercise counseling: Secondary | ICD-10-CM

## 2013-11-14 DIAGNOSIS — Z7901 Long term (current) use of anticoagulants: Secondary | ICD-10-CM

## 2013-11-14 DIAGNOSIS — I4891 Unspecified atrial fibrillation: Secondary | ICD-10-CM

## 2013-11-14 NOTE — Patient Instructions (Signed)
Your physician wants you to follow-up in: 1 year You will receive a reminder letter in the mail two months in advance. If you don't receive a letter, please call our office to schedule the follow-up appointment.    Your physician recommends that you continue on your current medications as directed. Please refer to the Current Medication list given to you today.     Thank you for choosing  [facility] !  

## 2013-11-14 NOTE — Progress Notes (Signed)
Patient ID: Autumn Powers, female   DOB: [birth year], 78 y.o.   MRN: [id number]      SUBJECTIVE: The patient is a 78 year old woman who is here today for followup of atrial fibrillation. She is maintained on atenolol, long-acting diltiazem, and warfarin. She also has hyperlipidemia and takes Crestor. She has a history of hypothyroidism. A Holter monitor worn last year demonstrated a controlled ventricular response. She has been feeling well. She is able to carry out her activities of daily living but occasionally has to take breaks. She denies palpitations and shortness of breath. She was told that she has diabetes and it was recommended that she begin walking 10,000 steps daily. ECG performed in the office today shows atrial fibrillation, heart rate 63 beats per minute, RSR prime pattern in V1, and a diffuse nonspecific ST segment and T wave abnormality.   No Known Allergies  Current Outpatient Prescriptions  Medication Sig Dispense Refill  . alendronate (FOSAMAX) 70 MG tablet Take 70 mg by mouth every 7 (seven) days. Take with a full glass of water on an empty stomach.       . Ascorbic Acid (VITAMIN C PO) Take by mouth daily.        Marland Kitchen atenolol (TENORMIN) 50 MG tablet TAKE 1 TABLET DAILY  90 tablet  2  . Calcium Carbonate-Vitamin D (CALCIUM + D PO) Take 1 tablet by mouth 2 (two) times daily.       Marland Kitchen CARDIZEM CD 240 MG 24 hr capsule TAKE 1 CAPSULE DAILY  90 capsule  2  . levothyroxine (SYNTHROID, LEVOTHROID) 50 MCG tablet Take 50 mcg by mouth daily.        . Multiple Vitamin (MULTI-VITAMIN PO) Take by mouth daily.        Marland Kitchen PARoxetine (PAXIL) 20 MG tablet Take 20 mg by mouth every morning.       . rosuvastatin (CRESTOR) 10 MG tablet Take 10 mg by mouth daily.       Marland Kitchen warfarin (COUMADIN) 4 MG tablet Take 1 tablet daily except 1 1/2 tablets on Fridays or as directed  100 tablet  3   No current facility-administered medications for this visit.   Facility-Administered Medications Ordered in Other  Visits  Medication Dose Route Frequency Provider Last Rate Last Dose  . 0.45 % sodium chloride infusion   Intravenous Continuous Yehuda Savannah, MD 75 mL/hr at 05/25/11 0950    . hydrocortisone cream 1 % 1 application  1 application Topical TID PRN Yehuda Savannah, MD      . sodium chloride 0.9 % injection 3 mL  3 mL Intravenous Q12H Yehuda Savannah, MD      . sodium chloride 0.9 % injection 3 mL  3 mL Intravenous PRN Yehuda Savannah, MD        Past Medical History  Diagnosis Date  . Hypothyroidism   . Osteoporosis   . Hx of adenomatous colonic polyps 2007    Also hyperplastic  . Arthritis     left foot in toes  . Anxiety     stress related to son's poor health  . Atrial fibrillation 2012    Newly diagnosed during hospitalization at [facility] in Sept of 2012. 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  . Tobacco abuse, in remission     Quit in 1990  . Chronic anticoagulation 2012    Past Surgical History  Procedure Laterality Date  . Right inguinal hernia repair    .  Hernia repair  1979    right inguinal hernia repair  . Colonoscopy  02/05/2011    Procedure: COLONOSCOPY;  Surgeon: Dorothyann Peng, MD;  Location: [facility];  Service: Endoscopy;  Laterality: N/A;  . Colonoscopy w/ polypectomy  2007  . Cardioversion  05/25/2011    Procedure: CARDIOVERSION;  Surgeon: Cristopher Estimable. Lattie Haw, MD;  Location: [facility] ORS;  Service: Cardiovascular;  Laterality: N/A;    History   Social History  . Marital Status: Divorced    Spouse Name: N/A    Number of Children: N/A  . Years of Education: N/A   Occupational History  . Not on file.   Social History Main Topics  . Smoking status: Former Smoker -- 1.00 packs/day for 30 years    Types: Cigarettes  . Smokeless tobacco: Not on file     Comment: quit at the age of 78 years age  . Alcohol Use: No  . Drug Use: No  . Sexual Activity: Not Currently   Other Topics Concern  . Not on file   Social History Narrative  . No  narrative on file     Filed Vitals:   11/14/13 1102  BP: 104/54  Pulse: 67  Height: 5\' 6"  (1.676 m)  Weight: 124 lb (56.246 kg)    PHYSICAL EXAM General: NAD Neck: No JVD, no thyromegaly. Lungs: Clear to auscultation bilaterally with normal respiratory effort. CV: Nondisplaced PMI.  Irregular rhythm, normal rate, normal S1/S2, no S3, no murmur. No pretibial or periankle edema.  Abdomen: Soft, nontender, no hepatosplenomegaly, no distention.  Neurologic: Alert and oriented x 3.  Psych: Normal affect. Extremities: No clubbing or cyanosis.   ECG: reviewed and available in electronic records.      ASSESSMENT AND PLAN: 1. Atrial fibrillation: Her heart rate is controlled on current doses of atenolol and long-acting diltiazem. She is tolerating warfarin without difficulty. No changes to medical therapy. 2. Exercise counseling provided for presumed diabetes.  Dispo: f/u 1 year.  Kate Sable, M.D., F.A.C.C.

## 2013-12-11 ENCOUNTER — Ambulatory Visit (INDEPENDENT_AMBULATORY_CARE_PROVIDER_SITE_OTHER): Payer: Medicare Other | Admitting: *Deleted

## 2013-12-11 DIAGNOSIS — Z7901 Long term (current) use of anticoagulants: Secondary | ICD-10-CM

## 2013-12-11 DIAGNOSIS — I749 Embolism and thrombosis of unspecified artery: Secondary | ICD-10-CM

## 2013-12-11 DIAGNOSIS — Z5181 Encounter for therapeutic drug level monitoring: Secondary | ICD-10-CM

## 2013-12-11 DIAGNOSIS — I4891 Unspecified atrial fibrillation: Secondary | ICD-10-CM

## 2013-12-11 LAB — POCT INR: INR: 2.3

## 2014-01-06 ENCOUNTER — Other Ambulatory Visit: Payer: Self-pay | Admitting: Cardiovascular Disease

## 2014-01-07 ENCOUNTER — Other Ambulatory Visit: Payer: Self-pay | Admitting: Cardiology

## 2014-01-18 ENCOUNTER — Ambulatory Visit (INDEPENDENT_AMBULATORY_CARE_PROVIDER_SITE_OTHER): Payer: Medicare Other | Admitting: *Deleted

## 2014-01-18 DIAGNOSIS — Z5181 Encounter for therapeutic drug level monitoring: Secondary | ICD-10-CM

## 2014-01-18 DIAGNOSIS — I8290 Acute embolism and thrombosis of unspecified vein: Secondary | ICD-10-CM

## 2014-01-18 DIAGNOSIS — I749 Embolism and thrombosis of unspecified artery: Secondary | ICD-10-CM

## 2014-01-18 DIAGNOSIS — I4891 Unspecified atrial fibrillation: Secondary | ICD-10-CM

## 2014-01-18 DIAGNOSIS — Z7901 Long term (current) use of anticoagulants: Secondary | ICD-10-CM

## 2014-01-18 LAB — POCT INR: INR: 2.3

## 2014-03-01 ENCOUNTER — Ambulatory Visit (INDEPENDENT_AMBULATORY_CARE_PROVIDER_SITE_OTHER): Payer: Medicare Other | Admitting: *Deleted

## 2014-03-01 DIAGNOSIS — I4891 Unspecified atrial fibrillation: Secondary | ICD-10-CM

## 2014-03-01 DIAGNOSIS — I749 Embolism and thrombosis of unspecified artery: Secondary | ICD-10-CM

## 2014-03-01 DIAGNOSIS — Z7901 Long term (current) use of anticoagulants: Secondary | ICD-10-CM

## 2014-03-01 DIAGNOSIS — I8290 Acute embolism and thrombosis of unspecified vein: Secondary | ICD-10-CM

## 2014-03-01 DIAGNOSIS — Z5181 Encounter for therapeutic drug level monitoring: Secondary | ICD-10-CM

## 2014-03-01 LAB — POCT INR: INR: 2.5

## 2014-04-11 ENCOUNTER — Ambulatory Visit (INDEPENDENT_AMBULATORY_CARE_PROVIDER_SITE_OTHER): Payer: Medicare Other | Admitting: *Deleted

## 2014-04-11 DIAGNOSIS — I4891 Unspecified atrial fibrillation: Secondary | ICD-10-CM

## 2014-04-11 DIAGNOSIS — I8290 Acute embolism and thrombosis of unspecified vein: Secondary | ICD-10-CM

## 2014-04-11 DIAGNOSIS — Z7901 Long term (current) use of anticoagulants: Secondary | ICD-10-CM

## 2014-04-11 DIAGNOSIS — Z5181 Encounter for therapeutic drug level monitoring: Secondary | ICD-10-CM

## 2014-04-11 LAB — POCT INR: INR: 2.3

## 2014-05-23 ENCOUNTER — Ambulatory Visit (INDEPENDENT_AMBULATORY_CARE_PROVIDER_SITE_OTHER): Payer: Medicare Other | Admitting: *Deleted

## 2014-05-23 DIAGNOSIS — I4891 Unspecified atrial fibrillation: Secondary | ICD-10-CM

## 2014-05-23 DIAGNOSIS — I8290 Acute embolism and thrombosis of unspecified vein: Secondary | ICD-10-CM

## 2014-05-23 DIAGNOSIS — Z5181 Encounter for therapeutic drug level monitoring: Secondary | ICD-10-CM

## 2014-05-23 DIAGNOSIS — Z7901 Long term (current) use of anticoagulants: Secondary | ICD-10-CM

## 2014-05-23 LAB — POCT INR: INR: 2.5

## 2014-07-04 ENCOUNTER — Ambulatory Visit (INDEPENDENT_AMBULATORY_CARE_PROVIDER_SITE_OTHER): Payer: Medicare Other | Admitting: *Deleted

## 2014-07-04 DIAGNOSIS — Z5181 Encounter for therapeutic drug level monitoring: Secondary | ICD-10-CM

## 2014-07-04 DIAGNOSIS — Z7901 Long term (current) use of anticoagulants: Secondary | ICD-10-CM

## 2014-07-04 DIAGNOSIS — I4891 Unspecified atrial fibrillation: Secondary | ICD-10-CM

## 2014-07-04 DIAGNOSIS — I8290 Acute embolism and thrombosis of unspecified vein: Secondary | ICD-10-CM

## 2014-07-04 LAB — POCT INR: INR: 2.2

## 2014-07-19 ENCOUNTER — Encounter (HOSPITAL_COMMUNITY): Payer: Self-pay | Admitting: Cardiology

## 2014-08-07 ENCOUNTER — Other Ambulatory Visit (HOSPITAL_COMMUNITY): Payer: Self-pay | Admitting: Family Medicine

## 2014-08-07 DIAGNOSIS — Z1231 Encounter for screening mammogram for malignant neoplasm of breast: Secondary | ICD-10-CM

## 2014-08-15 ENCOUNTER — Ambulatory Visit (INDEPENDENT_AMBULATORY_CARE_PROVIDER_SITE_OTHER): Payer: Medicare Other | Admitting: *Deleted

## 2014-08-15 DIAGNOSIS — Z5181 Encounter for therapeutic drug level monitoring: Secondary | ICD-10-CM

## 2014-08-15 DIAGNOSIS — Z7901 Long term (current) use of anticoagulants: Secondary | ICD-10-CM

## 2014-08-15 DIAGNOSIS — I8290 Acute embolism and thrombosis of unspecified vein: Secondary | ICD-10-CM

## 2014-08-15 DIAGNOSIS — I4891 Unspecified atrial fibrillation: Secondary | ICD-10-CM

## 2014-08-15 DIAGNOSIS — I480 Paroxysmal atrial fibrillation: Secondary | ICD-10-CM

## 2014-08-15 LAB — POCT INR: INR: 2.8

## 2014-09-07 ENCOUNTER — Ambulatory Visit (HOSPITAL_COMMUNITY)
Admission: RE | Admit: 2014-09-07 | Discharge: 2014-09-07 | Disposition: A | Discharge status: Home / Self Care | Payer: Medicare Other | Source: Ambulatory Visit | Attending: Family Medicine | Admitting: Family Medicine

## 2014-09-07 DIAGNOSIS — Z1231 Encounter for screening mammogram for malignant neoplasm of breast: Secondary | ICD-10-CM | POA: Diagnosis not present

## 2014-09-26 ENCOUNTER — Ambulatory Visit (INDEPENDENT_AMBULATORY_CARE_PROVIDER_SITE_OTHER): Payer: Medicare Other | Admitting: *Deleted

## 2014-09-26 DIAGNOSIS — I480 Paroxysmal atrial fibrillation: Secondary | ICD-10-CM

## 2014-09-26 DIAGNOSIS — Z7901 Long term (current) use of anticoagulants: Secondary | ICD-10-CM

## 2014-09-26 DIAGNOSIS — I4891 Unspecified atrial fibrillation: Secondary | ICD-10-CM

## 2014-09-26 DIAGNOSIS — Z5181 Encounter for therapeutic drug level monitoring: Secondary | ICD-10-CM

## 2014-09-26 DIAGNOSIS — I8290 Acute embolism and thrombosis of unspecified vein: Secondary | ICD-10-CM | POA: Diagnosis not present

## 2014-09-26 LAB — POCT INR: INR: 2.3

## 2014-09-28 ENCOUNTER — Other Ambulatory Visit: Payer: Self-pay | Admitting: Cardiovascular Disease

## 2014-11-07 ENCOUNTER — Ambulatory Visit (INDEPENDENT_AMBULATORY_CARE_PROVIDER_SITE_OTHER): Payer: Medicare Other | Admitting: *Deleted

## 2014-11-07 DIAGNOSIS — I4891 Unspecified atrial fibrillation: Secondary | ICD-10-CM | POA: Diagnosis not present

## 2014-11-07 DIAGNOSIS — Z5181 Encounter for therapeutic drug level monitoring: Secondary | ICD-10-CM | POA: Diagnosis not present

## 2014-11-07 DIAGNOSIS — Z7901 Long term (current) use of anticoagulants: Secondary | ICD-10-CM

## 2014-11-07 DIAGNOSIS — I8290 Acute embolism and thrombosis of unspecified vein: Secondary | ICD-10-CM | POA: Diagnosis not present

## 2014-11-07 LAB — POCT INR: INR: 2.3

## 2014-11-12 ENCOUNTER — Other Ambulatory Visit: Payer: Self-pay | Admitting: Cardiovascular Disease

## 2014-11-21 ENCOUNTER — Ambulatory Visit (INDEPENDENT_AMBULATORY_CARE_PROVIDER_SITE_OTHER): Payer: Medicare Other | Admitting: Cardiovascular Disease

## 2014-11-21 ENCOUNTER — Encounter: Payer: Self-pay | Admitting: Cardiovascular Disease

## 2014-11-21 VITALS — BP 102/58 | HR 58 | Ht 66.0 in | Wt 131.4 lb

## 2014-11-21 DIAGNOSIS — E785 Hyperlipidemia, unspecified: Secondary | ICD-10-CM

## 2014-11-21 DIAGNOSIS — I482 Chronic atrial fibrillation, unspecified: Secondary | ICD-10-CM

## 2014-11-21 DIAGNOSIS — R5383 Other fatigue: Secondary | ICD-10-CM

## 2014-11-21 DIAGNOSIS — Z7901 Long term (current) use of anticoagulants: Secondary | ICD-10-CM

## 2014-11-21 MED ORDER — DILTIAZEM HCL ER COATED BEADS 360 MG PO CP24: 360.0000 mg | ORAL_CAPSULE | Freq: Every day | ORAL | Status: DC

## 2014-11-21 NOTE — Patient Instructions (Addendum)
Your physician recommends that you schedule a follow-up appointment in: 3 months with Dr. Bronson Ing  Your physician has recommended you make the following change in your medication:  Increase Diltiazem to 360 mg at 8AM  Thank you for choosing [facility]!

## 2014-11-21 NOTE — Addendum Note (Signed)
Addended by: Levonne Hubert on: 11/21/2014 11:49 AM   Modules accepted: Level of Service

## 2014-11-21 NOTE — Addendum Note (Signed)
Addended by: Levonne Hubert on: 11/21/2014 11:49 AM   Modules accepted: Orders, Level of Service

## 2014-11-21 NOTE — Progress Notes (Addendum)
Patient ID: Autumn Powers, female   DOB: [birth year], 79 y.o.   MRN: [id number]      SUBJECTIVE: The patient presents for follow-up of atrial fibrillation. She is maintained on atenolol, diltiazem, and warfarin. She also has a history of hypothyroidism and hyperlipidemia.  She denies chest pain, palpitations, and shortness of breath. However, she becomes fatigued after walking half a block and this is been happening for the past one year. She denies tobacco use.  ECG performed in the office today demonstrates atrial fibrillation, heart rate 78 bpm, with a diffuse nonspecific ST segment and T-wave abnormality.  Review of Systems: As per "subjective", otherwise negative.  No Known Allergies  Current Outpatient Prescriptions  Medication Sig Dispense Refill  . alendronate (FOSAMAX) 70 MG tablet Take 70 mg by mouth every 7 (seven) days. Take with a full glass of water on an empty stomach.     . Ascorbic Acid (VITAMIN C PO) Take by mouth daily.      Marland Kitchen atenolol (TENORMIN) 50 MG tablet TAKE 1 TABLET DAILY 90 tablet 3  . Calcium Carbonate-Vitamin D (CALCIUM + D PO) Take 1 tablet by mouth 2 (two) times daily.     Marland Kitchen diltiazem (CARDIZEM CD) 240 MG 24 hr capsule TAKE 1 CAPSULE DAILY 90 capsule 3  . levothyroxine (SYNTHROID, LEVOTHROID) 50 MCG tablet Take 50 mcg by mouth daily.      . Multiple Vitamin (MULTI-VITAMIN PO) Take by mouth daily.      Marland Kitchen PARoxetine (PAXIL) 20 MG tablet Take 20 mg by mouth every morning.     . rosuvastatin (CRESTOR) 10 MG tablet Take 10 mg by mouth daily.     Marland Kitchen warfarin (COUMADIN) 4 MG tablet TAKE 1 TABLET DAILY EXCEPT FRIDAY TAKE ONE AND ONE-HALF TABLETS OR AS DIRECTED 100 tablet 2   No current facility-administered medications for this visit.   Facility-Administered Medications Ordered in Other Visits  Medication Dose Route Frequency Provider Last Rate Last Dose  . 0.45 % sodium chloride infusion   Intravenous Continuous Yehuda Savannah, MD 75 mL/hr at 05/25/11 0950      . hydrocortisone cream 1 % 1 application  1 application Topical TID PRN Yehuda Savannah, MD      . sodium chloride 0.9 % injection 3 mL  3 mL Intravenous Q12H Yehuda Savannah, MD      . sodium chloride 0.9 % injection 3 mL  3 mL Intravenous PRN Yehuda Savannah, MD        Past Medical History  Diagnosis Date  . Hypothyroidism   . Osteoporosis   . Hx of adenomatous colonic polyps 2007    Also hyperplastic  . Arthritis     left foot in toes  . Anxiety     stress related to son's poor health  . Atrial fibrillation 2012    Newly diagnosed during hospitalization at [facility] in Sept of 2012. 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  . Tobacco abuse, in remission     Quit in 1990  . Chronic anticoagulation 2012    Past Surgical History  Procedure Laterality Date  . Right inguinal hernia repair    . Hernia repair  1979    right inguinal hernia repair  . Colonoscopy  02/05/2011    Procedure: COLONOSCOPY;  Surgeon: Dorothyann Peng, MD;  Location: [facility];  Service: Endoscopy;  Laterality: N/A;  . Colonoscopy w/ polypectomy  2007  . Cardioversion  05/25/2011  Procedure: CARDIOVERSION;  Surgeon: Cristopher Estimable. Lattie Haw, MD;  Location: [facility] ORS;  Service: Cardiovascular;  Laterality: N/A;    History   Social History  . Marital Status: Divorced    Spouse Name: N/A  . Number of Children: N/A  . Years of Education: N/A   Occupational History  . Not on file.   Social History Main Topics  . Smoking status: Former Smoker -- 1.00 packs/day for 30 years    Types: Cigarettes    Start date: 07/07/1955    Quit date: 07/06/1998  . Smokeless tobacco: Not on file     Comment: quit at the age of 65 years age  . Alcohol Use: No  . Drug Use: No  . Sexual Activity: Not Currently   Other Topics Concern  . Not on file   Social History Narrative     Filed Vitals:   11/21/14 1115  BP: 102/58  Pulse: 58  Height: 5\' 6"  (1.676 m)  Weight: 131 lb 6.4 oz (59.603 kg)  SpO2:  96%    PHYSICAL EXAM General: NAD HEENT: Normal. Neck: No JVD, no thyromegaly. Lungs: Clear to auscultation bilaterally with normal respiratory effort. CV: Tachycardic, irregular rhythm, normal S1/S2, no S3, no murmur. No pretibial or periankle edema.   Abdomen: Soft, nontender, no distention.  Neurologic: Alert and oriented x 3.  Psych: Normal affect. Skin: Normal. Musculoskeletal: Normal range of motion, no gross deformities. Extremities: No clubbing or cyanosis.   ECG: Most recent ECG reviewed.      ASSESSMENT AND PLAN: 1. Fatigue in context of rapid atrial fibrillation: I suspect symptoms are likely due to rapid heart rate. Will increase long-acting diltiazem to 360 mg daily and have her take it in the morning, two hours prior to walking. Continue atenolol 50 mg which she takes in the evening. No bleeding problems with warfarin. If increase in AV nodal agents does not relieve symptoms, I would consider Lexiscan Cardiolite stress test given nonspecific abnormalities on resting ECG.  2. Hyperlipidemia: On Crestor 10 mg.  Dispo: f/u 3 months.  Kate Sable, M.D., F.A.C.C.

## 2014-11-30 ENCOUNTER — Other Ambulatory Visit: Payer: Self-pay | Admitting: Cardiovascular Disease

## 2014-12-19 ENCOUNTER — Ambulatory Visit (INDEPENDENT_AMBULATORY_CARE_PROVIDER_SITE_OTHER): Payer: Medicare Other | Admitting: *Deleted

## 2014-12-19 DIAGNOSIS — Z5181 Encounter for therapeutic drug level monitoring: Secondary | ICD-10-CM | POA: Diagnosis not present

## 2014-12-19 DIAGNOSIS — Z7901 Long term (current) use of anticoagulants: Secondary | ICD-10-CM | POA: Diagnosis not present

## 2014-12-19 DIAGNOSIS — I4891 Unspecified atrial fibrillation: Secondary | ICD-10-CM | POA: Diagnosis not present

## 2014-12-19 DIAGNOSIS — I8290 Acute embolism and thrombosis of unspecified vein: Secondary | ICD-10-CM

## 2014-12-19 LAB — POCT INR: INR: 2.2

## 2015-01-30 ENCOUNTER — Ambulatory Visit (INDEPENDENT_AMBULATORY_CARE_PROVIDER_SITE_OTHER): Payer: Medicare Other | Admitting: *Deleted

## 2015-01-30 DIAGNOSIS — Z7901 Long term (current) use of anticoagulants: Secondary | ICD-10-CM | POA: Diagnosis not present

## 2015-01-30 DIAGNOSIS — Z5181 Encounter for therapeutic drug level monitoring: Secondary | ICD-10-CM | POA: Diagnosis not present

## 2015-01-30 DIAGNOSIS — I4891 Unspecified atrial fibrillation: Secondary | ICD-10-CM | POA: Diagnosis not present

## 2015-01-30 DIAGNOSIS — I8290 Acute embolism and thrombosis of unspecified vein: Secondary | ICD-10-CM

## 2015-01-30 LAB — POCT INR: INR: 2.5

## 2015-02-25 ENCOUNTER — Ambulatory Visit: Payer: Medicare Other | Admitting: Cardiovascular Disease

## 2015-03-13 ENCOUNTER — Ambulatory Visit (INDEPENDENT_AMBULATORY_CARE_PROVIDER_SITE_OTHER): Payer: Medicare Other | Admitting: *Deleted

## 2015-03-13 DIAGNOSIS — I8290 Acute embolism and thrombosis of unspecified vein: Secondary | ICD-10-CM | POA: Diagnosis not present

## 2015-03-13 DIAGNOSIS — Z5181 Encounter for therapeutic drug level monitoring: Secondary | ICD-10-CM | POA: Diagnosis not present

## 2015-03-13 DIAGNOSIS — Z7901 Long term (current) use of anticoagulants: Secondary | ICD-10-CM

## 2015-03-13 DIAGNOSIS — I4891 Unspecified atrial fibrillation: Secondary | ICD-10-CM

## 2015-03-13 LAB — POCT INR: INR: 2.8

## 2015-04-05 ENCOUNTER — Encounter: Payer: Self-pay | Admitting: Cardiovascular Disease

## 2015-04-05 ENCOUNTER — Ambulatory Visit (INDEPENDENT_AMBULATORY_CARE_PROVIDER_SITE_OTHER): Payer: Medicare Other | Admitting: Cardiovascular Disease

## 2015-04-05 VITALS — BP 102/62 | HR 48 | Ht 67.0 in | Wt 123.6 lb

## 2015-04-05 DIAGNOSIS — E785 Hyperlipidemia, unspecified: Secondary | ICD-10-CM

## 2015-04-05 DIAGNOSIS — Z7901 Long term (current) use of anticoagulants: Secondary | ICD-10-CM | POA: Diagnosis not present

## 2015-04-05 DIAGNOSIS — I482 Chronic atrial fibrillation, unspecified: Secondary | ICD-10-CM

## 2015-04-05 DIAGNOSIS — R5383 Other fatigue: Secondary | ICD-10-CM

## 2015-04-05 NOTE — Patient Instructions (Signed)

## 2015-04-05 NOTE — Progress Notes (Signed)
Patient ID: Autumn Powers, female   DOB: 22-Sep-1935, 79 y.o.   MRN: [id number]      SUBJECTIVE: The patient presents for follow-up of atrial fibrillation. She is maintained on atenolol, diltiazem, and warfarin. She also has a history of hypothyroidism and hyperlipidemia.  Due to tachycardia and fatigue, I increased long-acting diltiazem to 360 mg at her last visit on 11/21/14.  She is now feeling much better, denying chest pain, palpitations, fatigue, and shortness of breath.  She is here with her daughter-in-law, Mikael Spray, who moved here 69 years ago from [place], [place].   Review of Systems: As per "subjective", otherwise negative.  No Known Allergies  Current Outpatient Prescriptions  Medication Sig Dispense Refill  . alendronate (FOSAMAX) 70 MG tablet Take 70 mg by mouth every 7 (seven) days. Take with a full glass of water on an empty stomach.     . Ascorbic Acid (VITAMIN C PO) Take by mouth daily.      Marland Kitchen atenolol (TENORMIN) 50 MG tablet TAKE 1 TABLET DAILY 90 tablet 2  . Calcium Carbonate-Vitamin D (CALCIUM + D PO) Take 1 tablet by mouth 2 (two) times daily.     Marland Kitchen diltiazem (CARDIZEM CD) 360 MG 24 hr capsule Take 1 capsule (360 mg total) by mouth daily. 90 capsule 3  . levothyroxine (SYNTHROID, LEVOTHROID) 50 MCG tablet Take 50 mcg by mouth daily.      . Multiple Vitamin (MULTI-VITAMIN PO) Take by mouth daily.      Marland Kitchen PARoxetine (PAXIL) 20 MG tablet Take 20 mg by mouth every morning.     . rosuvastatin (CRESTOR) 10 MG tablet Take 10 mg by mouth daily.     Marland Kitchen warfarin (COUMADIN) 4 MG tablet TAKE 1 TABLET DAILY EXCEPT FRIDAY TAKE ONE AND ONE-HALF TABLETS OR AS DIRECTED 100 tablet 2   No current facility-administered medications for this visit.   Facility-Administered Medications Ordered in Other Visits  Medication Dose Route Frequency Provider Last Rate Last Dose  . 0.45 % sodium chloride infusion   Intravenous Continuous Yehuda Savannah, MD 75 mL/hr at 05/25/11 0950    .  hydrocortisone cream 1 % 1 application  1 application Topical TID PRN Yehuda Savannah, MD      . sodium chloride 0.9 % injection 3 mL  3 mL Intravenous Q12H Yehuda Savannah, MD      . sodium chloride 0.9 % injection 3 mL  3 mL Intravenous PRN Yehuda Savannah, MD        Past Medical History  Diagnosis Date  . Hypothyroidism   . Osteoporosis   . Hx of adenomatous colonic polyps 2007    Also hyperplastic  . Arthritis     left foot in toes  . Anxiety     stress related to son's poor health  . Atrial fibrillation 2012    Newly diagnosed during hospitalization at [facility] in Sept of 2012. 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  . Tobacco abuse, in remission     Quit in 1990  . Chronic anticoagulation 2012    Past Surgical History  Procedure Laterality Date  . Right inguinal hernia repair    . Hernia repair  1979    right inguinal hernia repair  . Colonoscopy  02/05/2011    Procedure: COLONOSCOPY;  Surgeon: Dorothyann Peng, MD;  Location: [facility];  Service: Endoscopy;  Laterality: N/A;  . Colonoscopy w/ polypectomy  2007  . Cardioversion  05/25/2011  Procedure: CARDIOVERSION;  Surgeon: Cristopher Estimable. Lattie Haw, MD;  Location: [facility] ORS;  Service: Cardiovascular;  Laterality: N/A;    Social History   Social History  . Marital Status: Divorced    Spouse Name: N/A  . Number of Children: N/A  . Years of Education: N/A   Occupational History  . Not on file.   Social History Main Topics  . Smoking status: Former Smoker -- 1.00 packs/day for 30 years    Types: Cigarettes    Start date: 07/07/1955    Quit date: 07/06/1998  . Smokeless tobacco: Not on file     Comment: quit at the age of 71 years age  . Alcohol Use: No  . Drug Use: No  . Sexual Activity: Not Currently   Other Topics Concern  . Not on file   Social History Narrative     Filed Vitals:   04/05/15 1540  Pulse: 48  Height: 5\' 7"  (1.702 m)  Weight: 123 lb 9.6 oz (56.065 kg)  SpO2: 95%     PHYSICAL EXAM General: NAD HEENT: Normal. Neck: No JVD, no thyromegaly. Lungs: Clear to auscultation bilaterally with normal respiratory effort. CV: Normal rate, irregular rhythm, normal S1/S2, no S3, no murmur. No pretibial or periankle edema.  Abdomen: Soft, nontender, no distention.  Neurologic: Alert and oriented x 3.  Psych: Normal affect. Skin: Normal. Musculoskeletal: No gross deformities. Extremities: No clubbing or cyanosis.   ECG: Most recent ECG reviewed.      ASSESSMENT AND PLAN: 1. Fatigue in context of rapid atrial fibrillation: Symptomatically improved with increase of long-acting diltiazem to 360 mg daily. Continue atenolol 50 mg which she takes in the evening. No bleeding problems with warfarin.  2. Hyperlipidemia: On Crestor 10 mg.  Dispo: f/u 6 months.   Kate Sable, M.D., F.A.C.C.

## 2015-04-22 ENCOUNTER — Ambulatory Visit (INDEPENDENT_AMBULATORY_CARE_PROVIDER_SITE_OTHER): Payer: Medicare Other | Admitting: *Deleted

## 2015-04-22 DIAGNOSIS — Z5181 Encounter for therapeutic drug level monitoring: Secondary | ICD-10-CM

## 2015-04-22 DIAGNOSIS — I4891 Unspecified atrial fibrillation: Secondary | ICD-10-CM | POA: Diagnosis not present

## 2015-04-22 DIAGNOSIS — I8290 Acute embolism and thrombosis of unspecified vein: Secondary | ICD-10-CM | POA: Diagnosis not present

## 2015-04-22 DIAGNOSIS — Z7901 Long term (current) use of anticoagulants: Secondary | ICD-10-CM | POA: Diagnosis not present

## 2015-04-22 LAB — POCT INR: INR: 3

## 2015-06-03 ENCOUNTER — Ambulatory Visit (INDEPENDENT_AMBULATORY_CARE_PROVIDER_SITE_OTHER): Payer: Medicare Other | Admitting: *Deleted

## 2015-06-03 DIAGNOSIS — Z7901 Long term (current) use of anticoagulants: Secondary | ICD-10-CM | POA: Diagnosis not present

## 2015-06-03 DIAGNOSIS — I8290 Acute embolism and thrombosis of unspecified vein: Secondary | ICD-10-CM | POA: Diagnosis not present

## 2015-06-03 DIAGNOSIS — I4891 Unspecified atrial fibrillation: Secondary | ICD-10-CM

## 2015-06-03 DIAGNOSIS — Z5181 Encounter for therapeutic drug level monitoring: Secondary | ICD-10-CM | POA: Diagnosis not present

## 2015-06-03 LAB — POCT INR: INR: 2.6

## 2015-06-23 ENCOUNTER — Other Ambulatory Visit: Payer: Self-pay | Admitting: Cardiovascular Disease

## 2015-07-22 ENCOUNTER — Ambulatory Visit (INDEPENDENT_AMBULATORY_CARE_PROVIDER_SITE_OTHER): Payer: Medicare Other | Admitting: *Deleted

## 2015-07-22 DIAGNOSIS — Z7901 Long term (current) use of anticoagulants: Secondary | ICD-10-CM | POA: Diagnosis not present

## 2015-07-22 DIAGNOSIS — I4891 Unspecified atrial fibrillation: Secondary | ICD-10-CM | POA: Diagnosis not present

## 2015-07-22 DIAGNOSIS — I8290 Acute embolism and thrombosis of unspecified vein: Secondary | ICD-10-CM

## 2015-07-22 DIAGNOSIS — Z5181 Encounter for therapeutic drug level monitoring: Secondary | ICD-10-CM

## 2015-07-22 LAB — POCT INR: INR: 5.4

## 2015-07-31 ENCOUNTER — Ambulatory Visit (INDEPENDENT_AMBULATORY_CARE_PROVIDER_SITE_OTHER): Payer: Medicare Other | Admitting: *Deleted

## 2015-07-31 DIAGNOSIS — I8290 Acute embolism and thrombosis of unspecified vein: Secondary | ICD-10-CM | POA: Diagnosis not present

## 2015-07-31 DIAGNOSIS — Z7901 Long term (current) use of anticoagulants: Secondary | ICD-10-CM | POA: Diagnosis not present

## 2015-07-31 DIAGNOSIS — Z5181 Encounter for therapeutic drug level monitoring: Secondary | ICD-10-CM

## 2015-07-31 DIAGNOSIS — I4891 Unspecified atrial fibrillation: Secondary | ICD-10-CM

## 2015-07-31 LAB — POCT INR: INR: 2.7

## 2015-08-12 ENCOUNTER — Other Ambulatory Visit (HOSPITAL_COMMUNITY): Payer: Self-pay | Admitting: Family Medicine

## 2015-08-12 DIAGNOSIS — Z1231 Encounter for screening mammogram for malignant neoplasm of breast: Secondary | ICD-10-CM

## 2015-08-25 ENCOUNTER — Other Ambulatory Visit: Payer: Self-pay | Admitting: Cardiovascular Disease

## 2015-08-28 ENCOUNTER — Ambulatory Visit (INDEPENDENT_AMBULATORY_CARE_PROVIDER_SITE_OTHER): Payer: Medicare Other | Admitting: *Deleted

## 2015-08-28 DIAGNOSIS — Z5181 Encounter for therapeutic drug level monitoring: Secondary | ICD-10-CM

## 2015-08-28 DIAGNOSIS — I8290 Acute embolism and thrombosis of unspecified vein: Secondary | ICD-10-CM

## 2015-08-28 DIAGNOSIS — Z7901 Long term (current) use of anticoagulants: Secondary | ICD-10-CM | POA: Diagnosis not present

## 2015-08-28 DIAGNOSIS — I4891 Unspecified atrial fibrillation: Secondary | ICD-10-CM

## 2015-08-28 LAB — POCT INR: INR: 2.3

## 2015-09-11 ENCOUNTER — Ambulatory Visit (INDEPENDENT_AMBULATORY_CARE_PROVIDER_SITE_OTHER): Payer: Medicare Other | Admitting: Cardiovascular Disease

## 2015-09-11 ENCOUNTER — Encounter: Payer: Self-pay | Admitting: Cardiovascular Disease

## 2015-09-11 VITALS — BP 96/58 | HR 48 | Ht 66.0 in | Wt 120.2 lb

## 2015-09-11 DIAGNOSIS — E785 Hyperlipidemia, unspecified: Secondary | ICD-10-CM

## 2015-09-11 DIAGNOSIS — I4821 Permanent atrial fibrillation: Secondary | ICD-10-CM

## 2015-09-11 DIAGNOSIS — I482 Chronic atrial fibrillation: Secondary | ICD-10-CM | POA: Diagnosis not present

## 2015-09-11 NOTE — Progress Notes (Signed)
Patient ID: Autumn Powers, female   DOB: [birth year], 80 y.o.   MRN: [id number]      SUBJECTIVE: The patient presents for follow-up of atrial fibrillation. She is maintained on atenolol, diltiazem, and warfarin. She also has a history of hypothyroidism and hyperlipidemia.  She is now feeling much better, denying chest pain, palpitations, fatigue, and shortness of breath.  She noticed a marked improvement in her energy levels after I increased the diltiazem dose last year.   Review of Systems: As per "subjective", otherwise negative.  No Known Allergies  Current Outpatient Prescriptions  Medication Sig Dispense Refill  . alendronate (FOSAMAX) 70 MG tablet Take 70 mg by mouth every 7 (seven) days. Take with a full glass of water on an empty stomach.     . Ascorbic Acid (VITAMIN C PO) Take by mouth daily.      Marland Kitchen atenolol (TENORMIN) 50 MG tablet TAKE 1 TABLET DAILY 90 tablet 1  . Calcium Carbonate-Vitamin D (CALCIUM + D PO) Take 1 tablet by mouth 2 (two) times daily.     Marland Kitchen diltiazem (CARDIZEM CD) 360 MG 24 hr capsule Take 1 capsule (360 mg total) by mouth daily. 90 capsule 3  . levothyroxine (SYNTHROID, LEVOTHROID) 50 MCG tablet Take 50 mcg by mouth daily.      . Multiple Vitamin (MULTI-VITAMIN PO) Take by mouth daily.      Marland Kitchen PARoxetine (PAXIL) 20 MG tablet Take 20 mg by mouth every morning.     . rosuvastatin (CRESTOR) 10 MG tablet Take 10 mg by mouth daily.     Marland Kitchen warfarin (COUMADIN) 4 MG tablet TAKE 1 TABLET DAILY, EXCEPT FRIDAY TAKE ONE AND ONE-HALF TABLETS OR AS DIRECTED 100 tablet 1   No current facility-administered medications for this visit.   Facility-Administered Medications Ordered in Other Visits  Medication Dose Route Frequency Provider Last Rate Last Dose  . 0.45 % sodium chloride infusion   Intravenous Continuous Yehuda Savannah, MD 75 mL/hr at 05/25/11 0950    . hydrocortisone cream 1 % 1 application  1 application Topical TID PRN Yehuda Savannah, MD      . sodium  chloride 0.9 % injection 3 mL  3 mL Intravenous Q12H Yehuda Savannah, MD      . sodium chloride 0.9 % injection 3 mL  3 mL Intravenous PRN Yehuda Savannah, MD        Past Medical History  Diagnosis Date  . Hypothyroidism   . Osteoporosis   . Hx of adenomatous colonic polyps 2007    Also hyperplastic  . Arthritis     left foot in toes  . Anxiety     stress related to son's poor health  . Atrial fibrillation ([place]) 2012    Newly diagnosed during hospitalization at [facility] in Sept of 2012. 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  . Tobacco abuse, in remission     Quit in 1990  . Chronic anticoagulation 2012    Past Surgical History  Procedure Laterality Date  . Right inguinal hernia repair    . Hernia repair  1979    right inguinal hernia repair  . Colonoscopy  02/05/2011    Procedure: COLONOSCOPY;  Surgeon: Dorothyann Peng, MD;  Location: [facility];  Service: Endoscopy;  Laterality: N/A;  . Colonoscopy w/ polypectomy  2007  . Cardioversion  05/25/2011    Procedure: CARDIOVERSION;  Surgeon: Cristopher Estimable. Lattie Haw, MD;  Location: [facility] OR[facility];  Service: Cardiovascular;  Laterality: N/A;    Social History   Social History  . Marital Status: Divorced    Spouse Name: N/A  . Number of Children: N/A  . Years of Education: N/A   Occupational History  . Not on file.   Social History Main Topics  . Smoking status: Former Smoker -- 1.00 packs/day for 30 years    Types: Cigarettes    Start date: 07/07/1955    Quit date: 07/06/1998  . Smokeless tobacco: Not on file     Comment: quit at the age of 67 years age  . Alcohol Use: No  . Drug Use: No  . Sexual Activity: Not Currently   Other Topics Concern  . Not on file   Social History Narrative     Filed Vitals:   09/11/15 1504  BP: 96/58  Pulse: 48  Height: 5\' 6"  (1.676 m)  Weight: 120 lb 3.2 oz (54.522 kg)  SpO2: 96%    PHYSICAL EXAM General: NAD HEENT: Normal. Neck: No JVD, no thyromegaly. Lungs: Clear  to auscultation bilaterally with normal respiratory effort. CV: Normal rate, irregular rhythm, normal S1/S2, no S3, no murmur. No pretibial or periankle edema.  Abdomen: Soft, nontender, no distention.  Neurologic: Alert and oriented x 3.  Psych: Normal affect. Skin: Normal. Musculoskeletal: No gross deformities. Extremities: No clubbing or cyanosis.   ECG: Most recent ECG reviewed.      ASSESSMENT AND PLAN: 1. Permanent atrial fibrillation: Symptomatically stable on long-acting diltiazem 360 mg daily. Continue atenolol 50 mg which she takes in the evening. No bleeding problems with warfarin.  2. Hyperlipidemia: On Crestor 10 mg.  Dispo: f/u 1 year.   Kate Sable, M.D., F.A.C.C.

## 2015-09-11 NOTE — Patient Instructions (Signed)

## 2015-09-23 ENCOUNTER — Ambulatory Visit (INDEPENDENT_AMBULATORY_CARE_PROVIDER_SITE_OTHER): Payer: Medicare Other | Admitting: *Deleted

## 2015-09-23 ENCOUNTER — Ambulatory Visit (HOSPITAL_COMMUNITY)
Admission: RE | Admit: 2015-09-23 | Discharge: 2015-09-23 | Disposition: A | Discharge status: Home / Self Care | Payer: Medicare Other | Source: Ambulatory Visit | Attending: Family Medicine | Admitting: Family Medicine

## 2015-09-23 DIAGNOSIS — Z5181 Encounter for therapeutic drug level monitoring: Secondary | ICD-10-CM | POA: Diagnosis not present

## 2015-09-23 DIAGNOSIS — Z1231 Encounter for screening mammogram for malignant neoplasm of breast: Secondary | ICD-10-CM | POA: Insufficient documentation

## 2015-09-23 DIAGNOSIS — I8290 Acute embolism and thrombosis of unspecified vein: Secondary | ICD-10-CM

## 2015-09-23 DIAGNOSIS — I4891 Unspecified atrial fibrillation: Secondary | ICD-10-CM | POA: Diagnosis not present

## 2015-09-23 DIAGNOSIS — Z7901 Long term (current) use of anticoagulants: Secondary | ICD-10-CM | POA: Diagnosis not present

## 2015-09-23 LAB — POCT INR: INR: 2.1

## 2015-09-30 ENCOUNTER — Telehealth: Payer: Self-pay | Admitting: *Deleted

## 2015-09-30 NOTE — Telephone Encounter (Signed)
Was started on Metformin 500mg  by PCP.  Concerned about taking with Warfarin.. Told pt it is OK to start metformin with coumadin.

## 2015-09-30 NOTE — Telephone Encounter (Signed)
Please call patient regarding Warfarin and medication for diabetes interaction. / tg

## 2015-11-01 ENCOUNTER — Other Ambulatory Visit: Payer: Self-pay | Admitting: Cardiovascular Disease

## 2015-11-04 ENCOUNTER — Ambulatory Visit (INDEPENDENT_AMBULATORY_CARE_PROVIDER_SITE_OTHER): Payer: Medicare Other | Admitting: *Deleted

## 2015-11-04 DIAGNOSIS — I8290 Acute embolism and thrombosis of unspecified vein: Secondary | ICD-10-CM | POA: Diagnosis not present

## 2015-11-04 DIAGNOSIS — Z7901 Long term (current) use of anticoagulants: Secondary | ICD-10-CM

## 2015-11-04 DIAGNOSIS — I4891 Unspecified atrial fibrillation: Secondary | ICD-10-CM | POA: Diagnosis not present

## 2015-11-04 DIAGNOSIS — Z5181 Encounter for therapeutic drug level monitoring: Secondary | ICD-10-CM

## 2015-11-04 LAB — POCT INR: INR: 2.3

## 2015-12-16 ENCOUNTER — Ambulatory Visit (INDEPENDENT_AMBULATORY_CARE_PROVIDER_SITE_OTHER): Payer: Medicare Other | Admitting: *Deleted

## 2015-12-16 DIAGNOSIS — Z7901 Long term (current) use of anticoagulants: Secondary | ICD-10-CM | POA: Diagnosis not present

## 2015-12-16 DIAGNOSIS — I8290 Acute embolism and thrombosis of unspecified vein: Secondary | ICD-10-CM | POA: Diagnosis not present

## 2015-12-16 DIAGNOSIS — Z5181 Encounter for therapeutic drug level monitoring: Secondary | ICD-10-CM

## 2015-12-16 DIAGNOSIS — I4891 Unspecified atrial fibrillation: Secondary | ICD-10-CM | POA: Diagnosis not present

## 2015-12-16 LAB — POCT INR: INR: 2.2

## 2015-12-23 ENCOUNTER — Other Ambulatory Visit: Payer: Self-pay | Admitting: Cardiovascular Disease

## 2016-01-08 ENCOUNTER — Encounter: Payer: Self-pay | Admitting: Gastroenterology

## 2016-01-27 ENCOUNTER — Ambulatory Visit (INDEPENDENT_AMBULATORY_CARE_PROVIDER_SITE_OTHER): Payer: Medicare Other | Admitting: *Deleted

## 2016-01-27 ENCOUNTER — Encounter (INDEPENDENT_AMBULATORY_CARE_PROVIDER_SITE_OTHER): Payer: Self-pay

## 2016-01-27 DIAGNOSIS — I4891 Unspecified atrial fibrillation: Secondary | ICD-10-CM

## 2016-01-27 DIAGNOSIS — I8290 Acute embolism and thrombosis of unspecified vein: Secondary | ICD-10-CM | POA: Diagnosis not present

## 2016-01-27 DIAGNOSIS — Z7901 Long term (current) use of anticoagulants: Secondary | ICD-10-CM

## 2016-01-27 DIAGNOSIS — Z5181 Encounter for therapeutic drug level monitoring: Secondary | ICD-10-CM

## 2016-01-27 LAB — POCT INR: INR: 1.8

## 2016-02-22 ENCOUNTER — Other Ambulatory Visit: Payer: Self-pay | Admitting: Cardiovascular Disease

## 2016-02-26 ENCOUNTER — Ambulatory Visit (INDEPENDENT_AMBULATORY_CARE_PROVIDER_SITE_OTHER): Payer: Medicare Other | Admitting: *Deleted

## 2016-02-26 DIAGNOSIS — I4891 Unspecified atrial fibrillation: Secondary | ICD-10-CM

## 2016-02-26 DIAGNOSIS — Z7901 Long term (current) use of anticoagulants: Secondary | ICD-10-CM

## 2016-02-26 DIAGNOSIS — Z5181 Encounter for therapeutic drug level monitoring: Secondary | ICD-10-CM | POA: Diagnosis not present

## 2016-02-26 DIAGNOSIS — I8290 Acute embolism and thrombosis of unspecified vein: Secondary | ICD-10-CM | POA: Diagnosis not present

## 2016-02-26 LAB — POCT INR: INR: 2.6

## 2016-03-25 ENCOUNTER — Telehealth: Payer: Self-pay | Admitting: Gastroenterology

## 2016-03-25 NOTE — Telephone Encounter (Signed)
Pt is due for her colonoscopy and has Medicare and [employer]. Pt needs to be triaged. [id number]

## 2016-04-02 NOTE — Telephone Encounter (Signed)
I called pt and her last colonoscopy was 02/05/2011. She had tubular adenomas. She has been diagnosed since last here also with A Fib and is on coumadin. OV with Neil Crouch, PA on 04/22/2016 at 1:30 PM.

## 2016-04-08 ENCOUNTER — Ambulatory Visit (INDEPENDENT_AMBULATORY_CARE_PROVIDER_SITE_OTHER): Payer: Medicare Other | Admitting: *Deleted

## 2016-04-08 DIAGNOSIS — Z5181 Encounter for therapeutic drug level monitoring: Secondary | ICD-10-CM | POA: Diagnosis not present

## 2016-04-08 DIAGNOSIS — I4891 Unspecified atrial fibrillation: Secondary | ICD-10-CM | POA: Diagnosis not present

## 2016-04-08 DIAGNOSIS — I8290 Acute embolism and thrombosis of unspecified vein: Secondary | ICD-10-CM | POA: Diagnosis not present

## 2016-04-08 DIAGNOSIS — Z7901 Long term (current) use of anticoagulants: Secondary | ICD-10-CM

## 2016-04-08 LAB — POCT INR: INR: 2.6

## 2016-04-22 ENCOUNTER — Telehealth: Payer: Self-pay | Admitting: Gastroenterology

## 2016-04-22 ENCOUNTER — Encounter: Payer: Self-pay | Admitting: Gastroenterology

## 2016-04-22 ENCOUNTER — Ambulatory Visit (INDEPENDENT_AMBULATORY_CARE_PROVIDER_SITE_OTHER): Payer: Medicare Other | Admitting: Gastroenterology

## 2016-04-22 VITALS — BP 80/60 | HR 75 | Temp 97.6°F | Ht 65.0 in | Wt 110.4 lb

## 2016-04-22 DIAGNOSIS — Z86010 Personal history of colonic polyps: Secondary | ICD-10-CM | POA: Diagnosis not present

## 2016-04-22 NOTE — Telephone Encounter (Signed)
Patient will be scheduled for colonoscopy in the near future for history of colon polyps. Can we hold Coumadin for 4 days prior to procedure?

## 2016-04-22 NOTE — Telephone Encounter (Signed)
Please schedule TCS with [facility]. Day of prep, hold metformin. HOLD COUMADIN FOUR DAYS BEFORE PROCEDURE.

## 2016-04-22 NOTE — Telephone Encounter (Signed)
OK to hold coumadin 4 days prior to procedure.  Restart coumadin night of procedure if OK with MD.

## 2016-04-22 NOTE — Assessment & Plan Note (Signed)
80 year old Caucasian female with history of adenomatous colon polyps. Last colonoscopy 2012, 3 tubular adenomas removed at the time. She denies any GI symptoms other than occasional constipation. She notes that her weight increased to about 130 pounds last year but is slowly declined again. She has lost 10 pounds since March when she started metformin for newly diagnosed diabetes.  We have requested input from her cardiologist regarding holding Coumadin prior to her colonoscopy. Plan on colonoscopy in the near future.  I have discussed the risks, alternatives, benefits with regards to but not limited to the risk of reaction to medication, bleeding, infection, perforation and the patient is agreeable to proceed. Written consent to be obtained.  She will weigh herself weekly and if any ongoing weight loss, she will touch base with [place] or her PCP.

## 2016-04-22 NOTE — Patient Instructions (Signed)
1. We will call you to schedule your colonoscopy once we hear back from your cardiologist regarding your Coumadin therapy. We have requested stopping the Coumadin for 4 days prior to procedure.

## 2016-04-22 NOTE — Progress Notes (Signed)
cc'ed to pcp °

## 2016-04-22 NOTE — Progress Notes (Addendum)
REVIEWED-NO ADDITIONAL RECOMMENDATIONS.  Primary Care Physician:  Maricela Curet, MD  Primary Gastroenterologist:  Barney Drain, MD   Chief Complaint  Patient presents with  . Follow-up  . Colonoscopy    HPI:  Autumn Powers is a 80 y.o. female here to schedule surveillance colonoscopy for h/o adenomatous colon polyps. Last colonoscopy July 2012. 3 tubular adenomas removed, the largest 6 mm. She was diagnosed with A. fib shortly after that. She's been on chronic Coumadin therapy. She was diagnosed with diabetes back in March. Placed on metformin. She has lost 10 pounds since then. Occasional constipation, take stool softeners when necessary. Denies blood in the stool or melena. No abdominal pain. No heartburn, dysphagia, vomiting.    Current Outpatient Prescriptions  Medication Sig Dispense Refill  . alendronate (FOSAMAX) 70 MG tablet Take 70 mg by mouth every 7 (seven) days. Take with a full glass of water on an empty stomach.     . Ascorbic Acid (VITAMIN C PO) Take by mouth daily.      Marland Kitchen atenolol (TENORMIN) 50 MG tablet TAKE 1 TABLET DAILY 90 tablet 3  . Calcium Carbonate-Vitamin D (CALCIUM + D PO) Take 1 tablet by mouth 2 (two) times daily.     Marland Kitchen diltiazem (CARDIZEM CD) 360 MG 24 hr capsule TAKE 1 CAPSULE DAILY 90 capsule 2  . levothyroxine (SYNTHROID, LEVOTHROID) 50 MCG tablet Take 50 mcg by mouth daily.      . metFORMIN (GLUCOPHAGE) 500 MG tablet Take by mouth.    . Multiple Vitamin (MULTI-VITAMIN PO) Take by mouth daily.      Marland Kitchen PARoxetine (PAXIL) 20 MG tablet Take 20 mg by mouth every morning.     . rosuvastatin (CRESTOR) 10 MG tablet Take 10 mg by mouth daily.     Marland Kitchen warfarin (COUMADIN) 4 MG tablet TAKE 1 TABLET DAILY, EXCEPT FRIDAY TAKE ONE AND ONE-HALF TABLETS OR AS DIRECTED 100 tablet 3   No current facility-administered medications for this visit.    Facility-Administered Medications Ordered in Other Visits  Medication Dose Route Frequency Provider Last Rate Last Dose   . 0.45 % sodium chloride infusion   Intravenous Continuous Yehuda Savannah, MD 75 mL/hr at 05/25/11 0950    . hydrocortisone cream 1 % 1 application  1 application Topical TID PRN Yehuda Savannah, MD      . sodium chloride 0.9 % injection 3 mL  3 mL Intravenous Q12H Yehuda Savannah, MD      . sodium chloride 0.9 % injection 3 mL  3 mL Intravenous PRN Yehuda Savannah, MD        Allergies as of 04/22/2016  . (No Known Allergies)    Past Medical History:  Diagnosis Date  . Anxiety    stress related to son's poor health  . Arthritis    left foot in toes  . Atrial fibrillation ([place]) 2012   Newly diagnosed during hospitalization at [facility] in Sept of 2012. 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  . Chronic anticoagulation 2012  . Diabetes ([place])   . Hx of adenomatous colonic polyps 2007   Also hyperplastic  . Hypothyroidism   . Osteoporosis   . Tobacco abuse, in remission    Quit in 1990    Past Surgical History:  Procedure Laterality Date  . CARDIOVERSION  05/25/2011   Procedure: CARDIOVERSION;  Surgeon: Cristopher Estimable. Lattie Haw, MD;  Location: [facility];  Service: Cardiovascular;  Laterality: N/A;  . COLONOSCOPY  02/05/2011  Procedure: COLONOSCOPY;  Surgeon: Dorothyann Peng, MD;  Location: [facility];  Service: Endoscopy;  Laterality: N/A;  . COLONOSCOPY W/ POLYPECTOMY  2007  . HERNIA REPAIR  1979   right inguinal hernia repair  . right inguinal hernia repair      Family History  Problem Relation Age of Onset  . Stroke Mother   . Heart disease Father   . Heart attack Father   . Colon cancer Neg Hx     Social History   Social History  . Marital status: Divorced    Spouse name: N/A  . Number of children: N/A  . Years of education: N/A   Occupational History  . Not on file.   Social History Main Topics  . Smoking status: Former Smoker    Packs/day: 1.00    Years: 30.00    Types: Cigarettes    Start date: 07/07/1955    Quit date: 07/06/1998  .  Smokeless tobacco: Never Used     Comment: quit at the age of 59 years age  . Alcohol use No  . Drug use: No  . Sexual activity: Not Currently   Other Topics Concern  . Not on file   Social History Narrative  . No narrative on file      ROS:  General: Negative for anorexia,   fever, chills, fatigue, weakness.See history of present illness Eyes: Negative for vision changes.  ENT: Negative for hoarseness, difficulty swallowing , nasal congestion. CV: Negative for chest pain, angina, palpitations, dyspnea on exertion, peripheral edema.  Respiratory: Negative for dyspnea at rest, dyspnea on exertion, cough, sputum, wheezing.  GI: See history of present illness. GU:  Negative for dysuria, hematuria, urinary incontinence, urinary frequency, nocturnal urination.  MS: Negative for joint pain, low back pain.  Derm: Negative for rash or itching.  Neuro: Negative for weakness, abnormal sensation, seizure, frequent headaches, memory loss, confusion.  Psych: Negative for anxiety, depression, suicidal ideation, hallucinations.  Endo: Negative for unusual weight change.  Heme: Negative for bruising or bleeding. Allergy: Negative for rash or hives.    Physical Examination:  BP (!) 80/60   Pulse 75   Temp 97.6 F (36.4 C) (Oral)   Ht 5\' 5"  (1.651 m)   Wt 110 lb 6.4 oz (50.1 kg)   BMI 18.37 kg/m    General: Well-nourished, well-developed in no acute distress.  Head: Normocephalic, atraumatic.   Eyes: Conjunctiva pink, no icterus. Mouth: Oropharyngeal mucosa moist and pink , no lesions erythema or exudate. Neck: Supple without thyromegaly, masses, or lymphadenopathy.  Lungs: Clear to auscultation bilaterally.  Heart: Regular rate and rhythm, no murmurs rubs or gallops.  Abdomen: Bowel sounds are normal, nontender, nondistended, no hepatosplenomegaly or masses, no abdominal bruits or    hernia , no rebound or guarding.   Rectal: Deferred Extremities: No lower extremity edema. No  clubbing or deformities.  Neuro: Alert and oriented x 4 , grossly normal neurologically.  Skin: Warm and dry, no rash or jaundice.   Psych: Alert and cooperative, normal mood and affect.

## 2016-04-23 ENCOUNTER — Other Ambulatory Visit: Payer: Self-pay

## 2016-04-23 MED ORDER — PEG 3350-KCL-NA BICARB-NACL 420 G PO SOLR: 4000.0000 mL | ORAL | 0 refills | Status: DC

## 2016-04-23 NOTE — Telephone Encounter (Signed)
LMOM for pt to call office  

## 2016-04-23 NOTE — Telephone Encounter (Signed)
Forwarding to [facility] to schedule.  

## 2016-04-24 ENCOUNTER — Other Ambulatory Visit: Payer: Self-pay

## 2016-04-24 DIAGNOSIS — Z1211 Encounter for screening for malignant neoplasm of colon: Secondary | ICD-10-CM

## 2016-04-24 DIAGNOSIS — Z86010 Personal history of colonic polyps: Principal | ICD-10-CM

## 2016-04-24 NOTE — Telephone Encounter (Signed)
Called pt yesterday and scheduled TCS with [facility] 05/22/16 at 2:00 pm. Instructed pt to hold Coumadin after taking dose on 05/17/16 and restart night of procedure. No Metformin day of prep. Instructions given and mailed.

## 2016-05-22 ENCOUNTER — Encounter (HOSPITAL_COMMUNITY): Payer: Self-pay | Admitting: *Deleted

## 2016-05-22 ENCOUNTER — Encounter (HOSPITAL_COMMUNITY)
Admission: RE | Disposition: A | Discharge status: Home / Self Care | Payer: Self-pay | Source: Ambulatory Visit | Attending: Gastroenterology

## 2016-05-22 ENCOUNTER — Ambulatory Visit (HOSPITAL_COMMUNITY)
Admission: RE | Admit: 2016-05-22 | Discharge: 2016-05-22 | Disposition: A | Discharge status: Home / Self Care | Payer: Medicare Other | Source: Ambulatory Visit | Attending: Gastroenterology | Admitting: Gastroenterology

## 2016-05-22 DIAGNOSIS — F419 Anxiety disorder, unspecified: Secondary | ICD-10-CM | POA: Insufficient documentation

## 2016-05-22 DIAGNOSIS — M1990 Unspecified osteoarthritis, unspecified site: Secondary | ICD-10-CM | POA: Insufficient documentation

## 2016-05-22 DIAGNOSIS — I4891 Unspecified atrial fibrillation: Secondary | ICD-10-CM | POA: Diagnosis not present

## 2016-05-22 DIAGNOSIS — Z7901 Long term (current) use of anticoagulants: Secondary | ICD-10-CM | POA: Diagnosis not present

## 2016-05-22 DIAGNOSIS — K635 Polyp of colon: Secondary | ICD-10-CM | POA: Diagnosis not present

## 2016-05-22 DIAGNOSIS — Z87891 Personal history of nicotine dependence: Secondary | ICD-10-CM | POA: Diagnosis not present

## 2016-05-22 DIAGNOSIS — Z7984 Long term (current) use of oral hypoglycemic drugs: Secondary | ICD-10-CM | POA: Insufficient documentation

## 2016-05-22 DIAGNOSIS — E039 Hypothyroidism, unspecified: Secondary | ICD-10-CM | POA: Insufficient documentation

## 2016-05-22 DIAGNOSIS — K562 Volvulus: Secondary | ICD-10-CM | POA: Diagnosis not present

## 2016-05-22 DIAGNOSIS — K621 Rectal polyp: Secondary | ICD-10-CM | POA: Diagnosis not present

## 2016-05-22 DIAGNOSIS — Z79899 Other long term (current) drug therapy: Secondary | ICD-10-CM | POA: Insufficient documentation

## 2016-05-22 DIAGNOSIS — Z86010 Personal history of colonic polyps: Secondary | ICD-10-CM | POA: Insufficient documentation

## 2016-05-22 DIAGNOSIS — I10 Essential (primary) hypertension: Secondary | ICD-10-CM | POA: Insufficient documentation

## 2016-05-22 DIAGNOSIS — D123 Benign neoplasm of transverse colon: Secondary | ICD-10-CM | POA: Diagnosis not present

## 2016-05-22 DIAGNOSIS — E119 Type 2 diabetes mellitus without complications: Secondary | ICD-10-CM | POA: Insufficient documentation

## 2016-05-22 DIAGNOSIS — Z860101 Personal history of adenomatous and serrated colon polyps: Secondary | ICD-10-CM

## 2016-05-22 DIAGNOSIS — K648 Other hemorrhoids: Secondary | ICD-10-CM | POA: Diagnosis not present

## 2016-05-22 DIAGNOSIS — D125 Benign neoplasm of sigmoid colon: Secondary | ICD-10-CM | POA: Diagnosis not present

## 2016-05-22 DIAGNOSIS — Z1211 Encounter for screening for malignant neoplasm of colon: Secondary | ICD-10-CM

## 2016-05-22 HISTORY — PX: POLYPECTOMY: SHX5525

## 2016-05-22 HISTORY — PX: COLONOSCOPY: SHX5424

## 2016-05-22 HISTORY — DX: Essential (primary) hypertension: I10

## 2016-05-22 LAB — GLUCOSE, CAPILLARY: Glucose-Capillary: 126 mg/dL — ABNORMAL HIGH (ref 65–99)

## 2016-05-22 SURGERY — COLONOSCOPY
Anesthesia: Moderate Sedation

## 2016-05-22 MED ORDER — METOPROLOL TARTRATE 5 MG/5ML IV SOLN
2.5000 mg | Freq: Once | INTRAVENOUS | Status: AC
  Administered 2016-05-22: 2.5 mg via INTRAVENOUS

## 2016-05-22 MED ORDER — MEPERIDINE HCL 100 MG/ML IJ SOLN
INTRAMUSCULAR | Status: DC | PRN
  Administered 2016-05-22 (×2): 25 mg via INTRAVENOUS

## 2016-05-22 MED ORDER — MEPERIDINE HCL 100 MG/ML IJ SOLN
INTRAMUSCULAR | Status: AC
  Filled 2016-05-22: qty 2

## 2016-05-22 MED ORDER — SODIUM CHLORIDE 0.9 % IV SOLN
INTRAVENOUS | Status: DC
  Administered 2016-05-22: 08:00:00 via INTRAVENOUS

## 2016-05-22 MED ORDER — METOPROLOL TARTRATE 5 MG/5ML IV SOLN
INTRAVENOUS | Status: AC
  Filled 2016-05-22: qty 5

## 2016-05-22 MED ORDER — MIDAZOLAM HCL 5 MG/5ML IJ SOLN
INTRAMUSCULAR | Status: DC | PRN
  Administered 2016-05-22: 2 mg via INTRAVENOUS
  Administered 2016-05-22: 1 mg via INTRAVENOUS

## 2016-05-22 MED ORDER — MIDAZOLAM HCL 5 MG/5ML IJ SOLN
INTRAMUSCULAR | Status: AC
  Filled 2016-05-22: qty 10

## 2016-05-22 NOTE — OR Nursing (Signed)
Dr. Oneida Alar notified of heart rate 107-140 and in atrial fibrillation. Orders received and carried out.

## 2016-05-22 NOTE — Discharge Instructions (Signed)
You have small linternal hemorrhoids. YOU HAVE diverticulosis IN YOUR LEFT COLON. You had 5 SMALL POLYPS REMOVED.   DRINK WATER TO KEEP YOUR URINE LIGHT YELLOW.  FOLLOW A HIGH FIBER DIET. AVOID ITEMS THAT CAUSE BLOATING. See info below.  RE-START COUMADIN TODAY.  Next colonoscopy in 5-10 years.  Colonoscopy Care After Read the instructions outlined below and refer to this sheet in the next week. These discharge instructions provide you with general information on caring for yourself after you leave the hospital. While your treatment has been planned according to the most current medical practices available, unavoidable complications occasionally occur. If you have any problems or questions after discharge, call DR. Chrislynn Mosely, [phone].  ACTIVITY  You may resume your regular activity, but move at a slower pace for the next 24 hours.   Take frequent rest periods for the next 24 hours.   Walking will help get rid of the air and reduce the bloated feeling in your belly (abdomen).   No driving for 24 hours (because of the medicine (anesthesia) used during the test).   You may shower.   Do not sign any important legal documents or operate any machinery for 24 hours (because of the anesthesia used during the test).    NUTRITION  Drink plenty of fluids.   You may resume your normal diet as instructed by your doctor.   Begin with a light meal and progress to your normal diet. Heavy or fried foods are harder to digest and may make you feel sick to your stomach (nauseated).   Avoid alcoholic beverages for 24 hours or as instructed.    MEDICATIONS  You may resume your normal medications.   WHAT YOU CAN EXPECT TODAY  Some feelings of bloating in the abdomen.   Passage of more gas than usual.   Spotting of blood in your stool or on the toilet paper  .  IF YOU HAD POLYPS REMOVED DURING THE COLONOSCOPY:  Eat a soft diet IF YOU HAVE NAUSEA, BLOATING, ABDOMINAL PAIN, OR  VOMITING.    FINDING OUT THE RESULTS OF YOUR TEST Not all test results are available during your visit. DR. Oneida Alar WILL CALL YOU WITHIN 14 DAYS OF YOUR PROCEDUE WITH YOUR RESULTS. Do not assume everything is normal if you have not heard from DR. Reeya Bound, CALL HER OFFICE AT [phone].  SEEK IMMEDIATE MEDICAL ATTENTION AND CALL THE OFFICE: [phone] IF:  You have more than a spotting of blood in your stool.   Your belly is swollen (abdominal distention).   You are nauseated or vomiting.   You have a temperature over 101F.   You have abdominal pain or discomfort that is severe or gets worse throughout the day.  High-Fiber Diet A high-fiber diet changes your normal diet to include more whole grains, legumes, fruits, and vegetables. Changes in the diet involve replacing refined carbohydrates with unrefined foods. The calorie level of the diet is essentially unchanged. The Dietary Reference Intake (recommended amount) for adult males is 38 grams per day. For adult females, it is 25 grams per day. Pregnant and lactating women should consume 28 grams of fiber per day. Fiber is the intact part of a plant that is not broken down during digestion. Functional fiber is fiber that has been isolated from the plant to provide a beneficial effect in the body. PURPOSE  Increase stool bulk.   Ease and regulate bowel movements.   Lower cholesterol.  REDUCE RISK OF COLON CANCER  INDICATIONS THAT YOU NEED  MORE FIBER  Constipation and hemorrhoids.   Uncomplicated diverticulosis (intestine condition) and irritable bowel syndrome.   Weight management.   As a protective measure against hardening of the arteries (atherosclerosis), diabetes, and cancer.   GUIDELINES FOR INCREASING FIBER IN THE DIET  Start adding fiber to the diet slowly. A gradual increase of about 5 more grams (2 slices of whole-wheat bread, 2 servings of most fruits or vegetables, or 1 bowl of high-fiber cereal) per day is best.  Too rapid an increase in fiber may result in constipation, flatulence, and bloating.   Drink enough water and fluids to keep your urine clear or pale yellow. Water, juice, or caffeine-free drinks are recommended. Not drinking enough fluid may cause constipation.   Eat a variety of high-fiber foods rather than one type of fiber.   Try to increase your intake of fiber through using high-fiber foods rather than fiber pills or supplements that contain small amounts of fiber.   The goal is to change the types of food eaten. Do not supplement your present diet with high-fiber foods, but replace foods in your present diet.   INCLUDE A VARIETY OF FIBER SOURCES  Replace refined and processed grains with whole grains, canned fruits with fresh fruits, and incorporate other fiber sources. White rice, white breads, and most bakery goods contain little or no fiber.   Brown whole-grain rice, buckwheat oats, and many fruits and vegetables are all good sources of fiber. These include: broccoli, Brussels sprouts, cabbage, cauliflower, beets, sweet potatoes, white potatoes (skin on), carrots, tomatoes, eggplant, squash, berries, fresh fruits, and dried fruits.   Cereals appear to be the richest source of fiber. Cereal fiber is found in whole grains and bran. Bran is the fiber-rich outer coat of cereal grain, which is largely removed in refining. In whole-grain cereals, the bran remains. In breakfast cereals, the largest amount of fiber is found in those with "bran" in their names. The fiber content is sometimes indicated on the label.   You may need to include additional fruits and vegetables each day.   In baking, for 1 cup white flour, you may use the following substitutions:   1 cup whole-wheat flour minus 2 tablespoons.   1/2 cup white flour plus 1/2 cup whole-wheat flour.   Polyps, Colon  A polyp is extra tissue that grows inside your body. Colon polyps grow in the large intestine. The large intestine,  also called the colon, is part of your digestive system. It is a long, hollow tube at the end of your digestive tract where your body makes and stores stool. Most polyps are not dangerous. They are benign. This means they are not cancerous. But over time, some types of polyps can turn into cancer. Polyps that are smaller than a pea are usually not harmful. But larger polyps could someday become or may already be cancerous. To be safe, doctors remove all polyps and test them.   PREVENTION There is not one sure way to prevent polyps. You might be able to lower your risk of getting them if you:  Eat more fruits and vegetables and less fatty food.   Do not smoke.   Avoid alcohol.   Exercise every day.   Lose weight if you are overweight.   Eating more calcium and folate can also lower your risk of getting polyps. Some foods that are rich in calcium are milk, cheese, and broccoli. Some foods that are rich in folate are chickpeas, kidney beans, and spinach.  Diverticulosis Diverticulosis is a common condition that develops when small pouches (diverticula) form in the wall of the colon. The risk of diverticulosis increases with age. It happens more often in people who eat a low-fiber diet. Most individuals with diverticulosis have no symptoms. Those individuals with symptoms usually experience belly (abdominal) pain, constipation, or loose stools (diarrhea).  HOME CARE INSTRUCTIONS  Increase the amount of fiber in your diet as directed by your caregiver or dietician. This may reduce symptoms of diverticulosis.   Drink at least 6 to 8 glasses of water each day to prevent constipation.   Try not to strain when you have a bowel movement.   Avoiding nuts and seeds to prevent complications is NOT NECESSARY.     FOODS HAVING HIGH FIBER CONTENT INCLUDE:  Fruits. Apple, peach, pear, tangerine, raisins, prunes.   Vegetables. Brussels sprouts, asparagus, broccoli, cabbage, carrot, cauliflower,  romaine lettuce, spinach, summer squash, tomato, winter squash, zucchini.   Starchy Vegetables. Baked beans, kidney beans, lima beans, split peas, lentils, potatoes (with skin).   Grains. Whole wheat bread, brown rice, bran flake cereal, plain oatmeal, white rice, shredded wheat, bran muffins.    SEEK IMMEDIATE MEDICAL CARE IF:  You develop increasing pain or severe bloating.   You have an oral temperature above 101F.   You develop vomiting or bowel movements that are bloody or black.   Hemorrhoids Hemorrhoids are dilated (enlarged) veins around the rectum. Sometimes clots will form in the veins. This makes them swollen and painful. These are called thrombosed hemorrhoids. Causes of hemorrhoids include:  Constipation.   Straining to have a bowel movement.   HEAVY LIFTING  HOME CARE INSTRUCTIONS  Eat a well balanced diet and drink 6 to 8 glasses of water every day to avoid constipation. You may also use a bulk laxative.   Avoid straining to have bowel movements.   Keep anal area dry and clean.   Do not use a donut shaped pillow or sit on the toilet for long periods. This increases blood pooling and pain.   Move your bowels when your body has the urge; this will require less straining and will decrease pain and pressure.

## 2016-05-22 NOTE — Op Note (Addendum)
[facility] Patient Name: Autumn Powers Procedure Date: 05/22/2016 8:48 AM MRN: [id number] Date of Birth: 1935/08/24 Attending MD: Barney Drain , MD CSN: [id number] Age: 80 Admit Type: Outpatient Procedure:                Colonoscopy WITH COLD FORCEPS/SNARE POLYPECTOMY Indications:              High risk colon cancer surveillance: Personal                            history of colonic polyps Providers:                Barney Drain, MD, Lurline Del, RN, Randa Spike,                            Technician Referring MD:             Ralene Bathe. Dondiego, MD Medicines:                Meperidine 50 mg IV, Midazolam 3 mg IV, LOPRESSOR                            2.5 mg iv Complications:            No immediate complications. Estimated Blood Loss:     Estimated blood loss was minimal. Procedure:                Pre-Anesthesia Assessment:                           - Prior to the procedure, a History and Physical                            was performed, and patient medications and                            allergies were reviewed. The patient's tolerance of                            previous anesthesia was also reviewed. The risks                            and benefits of the procedure and the sedation                            options and risks were discussed with the patient.                            All questions were answered, and informed consent                            was obtained. Prior Anticoagulants: The patient has                            taken Coumadin (warfarin), last dose was 5 days  prior to procedure. ASA Grade Assessment: II - A                            patient with mild systemic disease. After reviewing                            the risks and benefits, the patient was deemed in                            satisfactory condition to undergo the procedure.                            After obtaining informed consent, the colonoscope                        was passed under direct vision. Throughout the                            procedure, the patient's blood pressure, pulse, and                            oxygen saturations were monitored continuously. The                            EC-[id number] scope was introduced through                            the anus and advanced to the the cecum, identified                            by appendiceal orifice and ileocecal valve. The                            colonoscopy was somewhat difficult due to a                            tortuous colon. Successful completion of the                            procedure was aided by straightening and shortening                            the scope to obtain bowel loop reduction and                            COLOWRAP. The patient tolerated the procedure well.                            The quality of the bowel preparation was excellent.                            The ileocecal valve, appendiceal orifice, and  rectum were photographed. Scope In: 9:18:19 AM Scope Out: 9:48:36 AM Scope Withdrawal Time: 0 hours 23 minutes 43 seconds  Total Procedure Duration: 0 hours 30 minutes 17 seconds  Findings:      Three sessile polyps were found in the rectum and proximal transverse       colon. The polyps were 4 to 6 mm in size. These polyps were removed with       a hot snare. Resection and retrieval were complete.      Two sessile polyps were found in the sigmoid colon. The polyps were 2 to       3 mm in size. These polyps were removed with a cold biopsy forceps.       Resection and retrieval were complete.      Internal hemorrhoids were found. The hemorrhoids were small. Impression:               - THREE 4 to 6 mm polyps in the rectum(2) and in                            the proximal transverse colon, removed with a hot                            snare.                           - Two 2 to 3 mm polyps in the  sigmoid colon,                            removed with a cold biopsy forceps.                           - Internal hemorrhoids. Moderate Sedation:      Moderate (conscious) sedation was administered by the endoscopy nurse       and supervised by the endoscopist. The following parameters were       monitored: oxygen saturation, heart rate, blood pressure, and response       to care. Total physician intraservice time was 45 minutes. Recommendation:           - High fiber diet.                           - Continue present medications. RE-START COUMADIN                            TODAY @ GIE 2016 The managemenst of antithrombotic                            agents for patients undergoing GI ENDOSCOPY.                           - Await pathology results.                           - Repeat colonoscopy in 5-10 years for surveillance.                           -  Patient has a contact number available for                            emergencies. The signs and symptoms of potential                            delayed complications were discussed with the                            patient. Return to normal activities tomorrow.                            Written discharge instructions were provided to the                            patient. Procedure Code(s):        --- Professional ---                           [phone], Colonoscopy, flexible; with removal of                            tumor(s), polyp(s), or other lesion(s) by snare                            technique                           45380, 59, Colonoscopy, flexible; with biopsy,                            single or multiple                           99152, Moderate sedation services provided by the                            same physician or other qualified health care                            professional performing the diagnostic or                            therapeutic service that the sedation supports,                             requiring the presence of an independent trained                            observer to assist in the monitoring of the                            patient's level of consciousness and physiological                            status;  initial 15 minutes of intraservice time,                            patient age 43 years or older                           [phone], Moderate sedation services; each additional                            15 minutes intraservice time                           [phone], Moderate sedation services; each additional                            15 minutes intraservice time Diagnosis Code(s):        --- Professional ---                           Z86.010, Personal history of colonic polyps                           K62.1, Rectal polyp                           D12.3, Benign neoplasm of transverse colon (hepatic                            flexure or splenic flexure)                           D12.5, Benign neoplasm of sigmoid colon                           K64.8, Other hemorrhoids CPT copyright 2016 [employer]. All rights reserved. The codes documented in this report are preliminary and upon coder review may  be revised to meet current compliance requirements. Barney Drain, MD Barney Drain, MD 05/22/2016 10:19:57 AM This report has been signed electronically. Number of Addenda: 0

## 2016-05-22 NOTE — H&P (Addendum)
Primary Care Physician:  Maricela Curet, MD Primary Gastroenterologist:  Dr. Oneida Alar  Pre-Procedure History & Physical: HPI:  Autumn Powers is a 80 y.o. female here for  [place].  Past Medical History:  Diagnosis Date  . Anxiety    stress related to son's poor health  . Arthritis    left foot in toes  . Atrial fibrillation ([place]) 2012   Newly diagnosed during hospitalization at [facility] in Sept of 2012. 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  . Chronic anticoagulation 2012  . Diabetes ([place])   . Hx of adenomatous colonic polyps 2007   Also hyperplastic  . Hypertension   . Hypothyroidism   . Osteoporosis   . Tobacco abuse, in remission    Quit in 1990    Past Surgical History:  Procedure Laterality Date  . CARDIOVERSION  05/25/2011   Procedure: CARDIOVERSION;  Surgeon: Cristopher Estimable. Lattie Haw, MD;  Location: [facility] ORS;  Service: Cardiovascular;  Laterality: N/A;  . COLONOSCOPY  02/05/2011   Procedure: COLONOSCOPY;  Surgeon: Dorothyann Peng, MD;  Location: [facility] END[facility];  Service: Endoscopy;  Laterality: N/A;  . COLONOSCOPY W/ POLYPECTOMY  2007  . HERNIA REPAIR  1979   right inguinal hernia repair  . right inguinal hernia repair      Prior to Admission medications   Medication Sig Start Date End Date Taking? Authorizing Provider  acetaminophen (TYLENOL) 325 MG tablet Take 650 mg by mouth every 6 (six) hours as needed for mild pain.   Yes Historical Provider, MD  alendronate (FOSAMAX) 70 MG tablet Take 70 mg by mouth every 7 (seven) days. Take with a full glass of water on an empty stomach. Sundays   Yes Historical Provider, MD  Ascorbic Acid (VITAMIN C PO) Take 1 tablet by mouth daily.    Yes Historical Provider, MD  atenolol (TENORMIN) 50 MG tablet TAKE 1 TABLET DAILY 02/24/16  Yes Herminio Commons, MD  Calcium Carbonate-Vitamin D (CALCIUM + D PO) Take 1 tablet by mouth 2 (two) times daily.    Yes Historical Provider, MD  diltiazem (CARDIZEM CD) 360  MG 24 hr capsule TAKE 1 CAPSULE DAILY 11/01/15  Yes Herminio Commons, MD  docusate sodium (COLACE) 100 MG capsule Take 100 mg by mouth daily as needed for mild constipation.   Yes Historical Provider, MD  levothyroxine (SYNTHROID, LEVOTHROID) 50 MCG tablet Take 50 mcg by mouth daily.     Yes Historical Provider, MD  metFORMIN (GLUCOPHAGE) 500 MG tablet Take 500 mg by mouth 2 (two) times daily with a meal.    Yes Historical Provider, MD  Multiple Vitamin (MULTI-VITAMIN PO) Take 1 tablet by mouth daily.    Yes Historical Provider, MD  PARoxetine (PAXIL) 20 MG tablet Take 20 mg by mouth every morning.    Yes Historical Provider, MD  rosuvastatin (CRESTOR) 10 MG tablet Take 10 mg by mouth daily.    Yes Historical Provider, MD  warfarin (COUMADIN) 4 MG tablet TAKE 1 TABLET DAILY, EXCEPT FRIDAY TAKE ONE AND ONE-HALF TABLETS OR AS DIRECTED 12/23/15  Yes Satira Sark, MD    Allergies as of 04/24/2016  . (No Known Allergies)    Family History  Problem Relation Age of Onset  . Stroke Mother   . Heart disease Father   . Heart attack Father   . Colon cancer Neg Hx     Social History   Social History  . Marital status: Divorced  Spouse name: N/A  . Number of children: N/A  . Years of education: N/A   Occupational History  . Not on file.   Social History Main Topics  . Smoking status: Former Smoker    Packs/day: 1.00    Years: 30.00    Types: Cigarettes    Start date: 07/07/1955    Quit date: 07/06/1998  . Smokeless tobacco: Never Used     Comment: quit at the age of 51 years age  . Alcohol use No  . Drug use: No  . Sexual activity: Not Currently   Other Topics Concern  . Not on file   Social History Narrative  . No narrative on file    Review of Systems: See HPI, otherwise negative ROS   Physical Exam: BP 107/66   Pulse 68   Temp 97.4 F (36.3 C) (Oral)   Resp (!) 22   Ht 5\' 6"  (1.676 m)   Wt 116 lb (52.6 kg)   SpO2 94%   BMI 18.72 kg/m  General:   Alert,   pleasant and cooperative in NAD Head:  Normocephalic and atraumatic. Neck:  Supple; Lungs:  Clear throughout to auscultation.    Heart:  Rapid rate and irregular rhythm. Abdomen:  Soft, nontender and nondistended. Normal bowel sounds, without guarding, and without rebound.   Neurologic:  Alert and  oriented x4;  grossly normal neurologically.  Impression/Plan:    PERSONAL HISTORY OF POLYPS.  PLAN:  1. TCS TODAY. DISCUSSED PROCEDURE, BENEFITS, & RISKS: < 1% chance of medication reaction, bleeding, perforation, or rupture of spleen/liver.

## 2016-06-01 ENCOUNTER — Telehealth: Payer: Self-pay | Admitting: Gastroenterology

## 2016-06-01 NOTE — Telephone Encounter (Signed)
Pt is aware.  

## 2016-06-01 NOTE — Telephone Encounter (Signed)
Please call pt. She had ONE simple adenoma AND FOUR HYPERPLASTIC POLYPS REMOVED. DRINK WATER. EAT A High fiber diet. NEXT COLONOSCOPY IN 5-7 YEARS.

## 2016-06-01 NOTE — Telephone Encounter (Signed)
ON RECALL  °

## 2016-06-02 ENCOUNTER — Encounter (HOSPITAL_COMMUNITY): Payer: Self-pay | Admitting: Gastroenterology

## 2016-06-03 ENCOUNTER — Ambulatory Visit (INDEPENDENT_AMBULATORY_CARE_PROVIDER_SITE_OTHER): Payer: Medicare Other | Admitting: *Deleted

## 2016-06-03 DIAGNOSIS — I8290 Acute embolism and thrombosis of unspecified vein: Secondary | ICD-10-CM | POA: Diagnosis not present

## 2016-06-03 DIAGNOSIS — I4891 Unspecified atrial fibrillation: Secondary | ICD-10-CM

## 2016-06-03 DIAGNOSIS — Z5181 Encounter for therapeutic drug level monitoring: Secondary | ICD-10-CM

## 2016-06-03 DIAGNOSIS — Z7901 Long term (current) use of anticoagulants: Secondary | ICD-10-CM | POA: Diagnosis not present

## 2016-06-03 LAB — POCT INR: INR: 2.1

## 2016-07-15 ENCOUNTER — Ambulatory Visit (INDEPENDENT_AMBULATORY_CARE_PROVIDER_SITE_OTHER): Payer: Medicare Other | Admitting: *Deleted

## 2016-07-15 DIAGNOSIS — Z7901 Long term (current) use of anticoagulants: Secondary | ICD-10-CM

## 2016-07-15 DIAGNOSIS — I8290 Acute embolism and thrombosis of unspecified vein: Secondary | ICD-10-CM

## 2016-07-15 DIAGNOSIS — I4891 Unspecified atrial fibrillation: Secondary | ICD-10-CM

## 2016-07-15 DIAGNOSIS — Z5181 Encounter for therapeutic drug level monitoring: Secondary | ICD-10-CM

## 2016-07-15 LAB — POCT INR: INR: 2.3

## 2016-07-28 ENCOUNTER — Other Ambulatory Visit: Payer: Self-pay | Admitting: Cardiovascular Disease

## 2016-08-26 ENCOUNTER — Ambulatory Visit (INDEPENDENT_AMBULATORY_CARE_PROVIDER_SITE_OTHER): Payer: Medicare Other | Admitting: *Deleted

## 2016-08-26 DIAGNOSIS — Z5181 Encounter for therapeutic drug level monitoring: Secondary | ICD-10-CM

## 2016-08-26 DIAGNOSIS — Z7901 Long term (current) use of anticoagulants: Secondary | ICD-10-CM | POA: Diagnosis not present

## 2016-08-26 DIAGNOSIS — I4891 Unspecified atrial fibrillation: Secondary | ICD-10-CM | POA: Diagnosis not present

## 2016-08-26 DIAGNOSIS — I8290 Acute embolism and thrombosis of unspecified vein: Secondary | ICD-10-CM

## 2016-08-26 LAB — POCT INR: INR: 2.4

## 2016-09-24 ENCOUNTER — Ambulatory Visit (INDEPENDENT_AMBULATORY_CARE_PROVIDER_SITE_OTHER): Payer: Medicare Other | Admitting: Cardiovascular Disease

## 2016-09-24 ENCOUNTER — Encounter: Payer: Self-pay | Admitting: Cardiovascular Disease

## 2016-09-24 VITALS — BP 104/64 | HR 66 | Ht 65.0 in | Wt 108.0 lb

## 2016-09-24 DIAGNOSIS — I482 Chronic atrial fibrillation, unspecified: Secondary | ICD-10-CM

## 2016-09-24 DIAGNOSIS — Z7901 Long term (current) use of anticoagulants: Secondary | ICD-10-CM

## 2016-09-24 DIAGNOSIS — E7800 Pure hypercholesterolemia, unspecified: Secondary | ICD-10-CM | POA: Diagnosis not present

## 2016-09-24 NOTE — Progress Notes (Signed)
SUBJECTIVE: The patient presents for follow-up of atrial fibrillation. She is maintained on atenolol, diltiazem, and warfarin. She also has a history of hypothyroidism and hyperlipidemia.  The patient denies any symptoms of chest pain, palpitations, shortness of breath, lightheadedness, dizziness, leg swelling, orthopnea, PND, and syncope.  ECG performed in the office today demonstrates rate controlled atrial fibrillation, 82 bpm.  Her primary issue at present is seasonal allergies.   Review of Systems: As per "subjective", otherwise negative.  No Known Allergies  Current Outpatient Prescriptions  Medication Sig Dispense Refill  . acetaminophen (TYLENOL) 325 MG tablet Take 650 mg by mouth every 6 (six) hours as needed for mild pain.    Marland Kitchen alendronate (FOSAMAX) 70 MG tablet Take 70 mg by mouth every 7 (seven) days. Take with a full glass of water on an empty stomach. Sundays    . Ascorbic Acid (VITAMIN C PO) Take 1 tablet by mouth daily.     Marland Kitchen atenolol (TENORMIN) 50 MG tablet TAKE 1 TABLET DAILY 90 tablet 3  . Calcium Carbonate-Vitamin D (CALCIUM + D PO) Take 1 tablet by mouth 2 (two) times daily.     Marland Kitchen diltiazem (CARDIZEM CD) 360 MG 24 hr capsule TAKE 1 CAPSULE DAILY 90 capsule 2  . docusate sodium (COLACE) 100 MG capsule Take 100 mg by mouth daily as needed for mild constipation.    Marland Kitchen levothyroxine (SYNTHROID, LEVOTHROID) 50 MCG tablet Take 50 mcg by mouth daily.      . metFORMIN (GLUCOPHAGE) 500 MG tablet Take 500 mg by mouth daily with breakfast.     . Multiple Vitamin (MULTI-VITAMIN PO) Take 1 tablet by mouth daily.     Marland Kitchen PARoxetine (PAXIL) 20 MG tablet Take 20 mg by mouth every morning.     . rosuvastatin (CRESTOR) 10 MG tablet Take 10 mg by mouth daily.     Marland Kitchen warfarin (COUMADIN) 4 MG tablet TAKE 1 TABLET DAILY, EXCEPT FRIDAY TAKE ONE AND ONE-HALF TABLETS OR AS DIRECTED 100 tablet 3   No current facility-administered medications for this visit.    Facility-Administered  Medications Ordered in Other Visits  Medication Dose Route Frequency Provider Last Rate Last Dose  . 0.45 % sodium chloride infusion   Intravenous Continuous Yehuda Savannah, MD 75 mL/hr at 05/25/11 0950    . hydrocortisone cream 1 % 1 application  1 application Topical TID PRN Yehuda Savannah, MD      . sodium chloride 0.9 % injection 3 mL  3 mL Intravenous Q12H Yehuda Savannah, MD      . sodium chloride 0.9 % injection 3 mL  3 mL Intravenous PRN Yehuda Savannah, MD        Past Medical History:  Diagnosis Date  . Anxiety    stress related to son's poor health  . Arthritis    left foot in toes  . Atrial fibrillation ([place]) 2012   Newly diagnosed during hospitalization at [facility] in Sept of 2012. 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  . Chronic anticoagulation 2012  . Diabetes ([place])   . Hx of adenomatous colonic polyps 2007   Also hyperplastic  . Hypertension   . Hypothyroidism   . Osteoporosis   . Tobacco abuse, in remission    Quit in 1990    Past Surgical History:  Procedure Laterality Date  . CARDIOVERSION  05/25/2011   Procedure: CARDIOVERSION;  Surgeon: Cristopher Estimable. Lattie Haw, MD;  Location: [facility];  Service: Cardiovascular;  Laterality: N/A;  . COLONOSCOPY  02/05/2011   Procedure: COLONOSCOPY;  Surgeon: Dorothyann Peng, MD;  Location: [facility];  Service: Endoscopy;  Laterality: N/A;  . COLONOSCOPY N/A 05/22/2016   Procedure: COLONOSCOPY;  Surgeon: Danie Binder, MD;  Location: [facility];  Service: Endoscopy;  Laterality: N/A;  2:00 pm  . COLONOSCOPY W/ POLYPECTOMY  2007  . HERNIA REPAIR  1979   right inguinal hernia repair  . POLYPECTOMY  05/22/2016   Procedure: POLYPECTOMY;  Surgeon: Danie Binder, MD;  Location: [facility];  Service: Endoscopy;;  transverse colon polypectomy  . right inguinal hernia repair      Social History   Social History  . Marital status: Divorced    Spouse name: N/A  . Number of children: N/A  . Years of  education: N/A   Occupational History  . Not on file.   Social History Main Topics  . Smoking status: Former Smoker    Packs/day: 1.00    Years: 30.00    Types: Cigarettes    Start date: 07/07/1955    Quit date: 07/06/1998  . Smokeless tobacco: Never Used     Comment: quit at the age of 61 years age  . Alcohol use No  . Drug use: No  . Sexual activity: Not Currently   Other Topics Concern  . Not on file   Social History Narrative  . No narrative on file     Vitals:   09/24/16 1408  BP: 104/64  Pulse: 66  SpO2: 95%  Weight: 108 lb (49 kg)  Height: 5\' 5"  (1.651 m)    PHYSICAL EXAM General: NAD HEENT: Normal. Neck: No JVD, no thyromegaly. Lungs: Clear to auscultation bilaterally with normal respiratory effort. CV: Normal rate, irregular rhythm, normal S1/S2, no S3, no murmur. No pretibial or periankle edema.No carotid bruit.  Abdomen: Soft, nontender, no distention.  Neurologic: Alert and oriented x 3.  Psych: Normal affect. Skin: Normal. Musculoskeletal: No gross deformities. Extremities: No clubbing or cyanosis.     ECG: Most recent ECG reviewed.      ASSESSMENT AND PLAN:  1. Permanent atrial fibrillation: Symptomatically stable on long-acting diltiazem 360 mg daily. Continue atenolol 50 mg which she takes in the evening. No bleeding problems with warfarin. INR 2.4 on 08/26/16.  2. Hyperlipidemia: On Crestor 10 mg.  Dispo: f/u 1 year.  Kate Sable, M.D., F.A.C.C.

## 2016-09-24 NOTE — Patient Instructions (Signed)
Your physician wants you to follow-up in: 1 year Dr Koneswaran You will receive a reminder letter in the mail two months in advance. If you don't receive a letter, please call our office to schedule the follow-up appointment.     Your physician recommends that you continue on your current medications as directed. Please refer to the Current Medication list given to you today.    If you need a refill on your cardiac medications before your next appointment, please call your pharmacy.      Thank you for choosing [facility] !         

## 2016-10-07 ENCOUNTER — Ambulatory Visit (INDEPENDENT_AMBULATORY_CARE_PROVIDER_SITE_OTHER): Payer: Medicare Other | Admitting: *Deleted

## 2016-10-07 DIAGNOSIS — Z7901 Long term (current) use of anticoagulants: Secondary | ICD-10-CM | POA: Diagnosis not present

## 2016-10-07 DIAGNOSIS — I8290 Acute embolism and thrombosis of unspecified vein: Secondary | ICD-10-CM | POA: Diagnosis not present

## 2016-10-07 DIAGNOSIS — I4891 Unspecified atrial fibrillation: Secondary | ICD-10-CM | POA: Diagnosis not present

## 2016-10-07 DIAGNOSIS — Z5181 Encounter for therapeutic drug level monitoring: Secondary | ICD-10-CM

## 2016-10-07 LAB — POCT INR: INR: 2.1

## 2016-10-28 ENCOUNTER — Other Ambulatory Visit (HOSPITAL_COMMUNITY): Payer: Self-pay | Admitting: Family Medicine

## 2016-10-28 DIAGNOSIS — Z1231 Encounter for screening mammogram for malignant neoplasm of breast: Secondary | ICD-10-CM

## 2016-11-05 ENCOUNTER — Ambulatory Visit (HOSPITAL_COMMUNITY)
Admission: RE | Admit: 2016-11-05 | Discharge: 2016-11-05 | Disposition: A | Discharge status: Home / Self Care | Payer: Medicare Other | Source: Ambulatory Visit | Attending: Family Medicine | Admitting: Family Medicine

## 2016-11-05 DIAGNOSIS — Z1231 Encounter for screening mammogram for malignant neoplasm of breast: Secondary | ICD-10-CM | POA: Insufficient documentation

## 2016-11-18 ENCOUNTER — Ambulatory Visit (INDEPENDENT_AMBULATORY_CARE_PROVIDER_SITE_OTHER): Payer: Medicare Other | Admitting: *Deleted

## 2016-11-18 DIAGNOSIS — Z5181 Encounter for therapeutic drug level monitoring: Secondary | ICD-10-CM | POA: Diagnosis not present

## 2016-11-18 DIAGNOSIS — I8290 Acute embolism and thrombosis of unspecified vein: Secondary | ICD-10-CM | POA: Diagnosis not present

## 2016-11-18 DIAGNOSIS — I4891 Unspecified atrial fibrillation: Secondary | ICD-10-CM | POA: Diagnosis not present

## 2016-11-18 DIAGNOSIS — Z7901 Long term (current) use of anticoagulants: Secondary | ICD-10-CM | POA: Diagnosis not present

## 2016-11-18 LAB — POCT INR: INR: 2

## 2016-12-20 ENCOUNTER — Other Ambulatory Visit: Payer: Self-pay | Admitting: Cardiology

## 2016-12-30 ENCOUNTER — Ambulatory Visit (INDEPENDENT_AMBULATORY_CARE_PROVIDER_SITE_OTHER): Payer: Medicare Other | Admitting: *Deleted

## 2016-12-30 DIAGNOSIS — Z5181 Encounter for therapeutic drug level monitoring: Secondary | ICD-10-CM

## 2016-12-30 DIAGNOSIS — I4891 Unspecified atrial fibrillation: Secondary | ICD-10-CM | POA: Diagnosis not present

## 2016-12-30 DIAGNOSIS — Z7901 Long term (current) use of anticoagulants: Secondary | ICD-10-CM | POA: Diagnosis not present

## 2016-12-30 DIAGNOSIS — I8290 Acute embolism and thrombosis of unspecified vein: Secondary | ICD-10-CM

## 2016-12-30 LAB — POCT INR: INR: 2.5

## 2017-02-10 ENCOUNTER — Ambulatory Visit (INDEPENDENT_AMBULATORY_CARE_PROVIDER_SITE_OTHER): Payer: Medicare Other | Admitting: *Deleted

## 2017-02-10 DIAGNOSIS — Z7901 Long term (current) use of anticoagulants: Secondary | ICD-10-CM | POA: Diagnosis not present

## 2017-02-10 DIAGNOSIS — I8290 Acute embolism and thrombosis of unspecified vein: Secondary | ICD-10-CM | POA: Diagnosis not present

## 2017-02-10 DIAGNOSIS — I4891 Unspecified atrial fibrillation: Secondary | ICD-10-CM | POA: Diagnosis not present

## 2017-02-10 DIAGNOSIS — Z5181 Encounter for therapeutic drug level monitoring: Secondary | ICD-10-CM | POA: Diagnosis not present

## 2017-02-10 LAB — POCT INR: INR: 2.2

## 2017-02-18 ENCOUNTER — Other Ambulatory Visit: Payer: Self-pay | Admitting: Cardiovascular Disease

## 2017-03-22 ENCOUNTER — Ambulatory Visit (INDEPENDENT_AMBULATORY_CARE_PROVIDER_SITE_OTHER): Payer: Medicare Other | Admitting: *Deleted

## 2017-03-22 DIAGNOSIS — I4891 Unspecified atrial fibrillation: Secondary | ICD-10-CM

## 2017-03-22 DIAGNOSIS — I8290 Acute embolism and thrombosis of unspecified vein: Secondary | ICD-10-CM

## 2017-03-22 DIAGNOSIS — Z5181 Encounter for therapeutic drug level monitoring: Secondary | ICD-10-CM | POA: Diagnosis not present

## 2017-03-22 DIAGNOSIS — Z7901 Long term (current) use of anticoagulants: Secondary | ICD-10-CM

## 2017-03-22 LAB — POCT INR: INR: 4.3

## 2017-03-24 ENCOUNTER — Telehealth: Payer: Self-pay | Admitting: *Deleted

## 2017-03-24 NOTE — Telephone Encounter (Signed)
Pt called.  She was started on a Z pack yesterday for chest congestion.  She will continue current coumadin instructions and keep scheduled INR appt.  She verbalized understanding.

## 2017-04-07 ENCOUNTER — Ambulatory Visit (INDEPENDENT_AMBULATORY_CARE_PROVIDER_SITE_OTHER): Payer: Medicare Other | Admitting: *Deleted

## 2017-04-07 ENCOUNTER — Telehealth: Payer: Self-pay | Admitting: *Deleted

## 2017-04-07 DIAGNOSIS — I4891 Unspecified atrial fibrillation: Secondary | ICD-10-CM | POA: Diagnosis not present

## 2017-04-07 DIAGNOSIS — I8290 Acute embolism and thrombosis of unspecified vein: Secondary | ICD-10-CM | POA: Diagnosis not present

## 2017-04-07 DIAGNOSIS — Z5181 Encounter for therapeutic drug level monitoring: Secondary | ICD-10-CM

## 2017-04-07 DIAGNOSIS — Z7901 Long term (current) use of anticoagulants: Secondary | ICD-10-CM

## 2017-04-07 LAB — POCT INR: INR: 3.6

## 2017-04-07 NOTE — Telephone Encounter (Signed)
Please give pt a call  °

## 2017-04-07 NOTE — Telephone Encounter (Signed)
Pt needed to reschedule appt due to conflict.  Appt. Rescheduled.

## 2017-04-24 ENCOUNTER — Other Ambulatory Visit: Payer: Self-pay | Admitting: Cardiovascular Disease

## 2017-04-28 ENCOUNTER — Ambulatory Visit (INDEPENDENT_AMBULATORY_CARE_PROVIDER_SITE_OTHER): Payer: Medicare Other | Admitting: *Deleted

## 2017-04-28 DIAGNOSIS — Z7901 Long term (current) use of anticoagulants: Secondary | ICD-10-CM

## 2017-04-28 DIAGNOSIS — Z5181 Encounter for therapeutic drug level monitoring: Secondary | ICD-10-CM | POA: Diagnosis not present

## 2017-04-28 DIAGNOSIS — I8290 Acute embolism and thrombosis of unspecified vein: Secondary | ICD-10-CM

## 2017-04-28 DIAGNOSIS — I4891 Unspecified atrial fibrillation: Secondary | ICD-10-CM | POA: Diagnosis not present

## 2017-04-28 LAB — POCT INR: INR: 2.6

## 2017-05-26 ENCOUNTER — Ambulatory Visit (INDEPENDENT_AMBULATORY_CARE_PROVIDER_SITE_OTHER): Payer: Medicare Other | Admitting: *Deleted

## 2017-05-26 DIAGNOSIS — I8290 Acute embolism and thrombosis of unspecified vein: Secondary | ICD-10-CM

## 2017-05-26 DIAGNOSIS — Z5181 Encounter for therapeutic drug level monitoring: Secondary | ICD-10-CM | POA: Diagnosis not present

## 2017-05-26 DIAGNOSIS — Z7901 Long term (current) use of anticoagulants: Secondary | ICD-10-CM | POA: Diagnosis not present

## 2017-05-26 DIAGNOSIS — I4891 Unspecified atrial fibrillation: Secondary | ICD-10-CM

## 2017-05-26 LAB — POCT INR: INR: 2.7

## 2017-07-07 ENCOUNTER — Ambulatory Visit (INDEPENDENT_AMBULATORY_CARE_PROVIDER_SITE_OTHER): Payer: Medicare Other | Admitting: *Deleted

## 2017-07-07 DIAGNOSIS — Z5181 Encounter for therapeutic drug level monitoring: Secondary | ICD-10-CM

## 2017-07-07 DIAGNOSIS — I4891 Unspecified atrial fibrillation: Secondary | ICD-10-CM | POA: Diagnosis not present

## 2017-07-07 DIAGNOSIS — I8290 Acute embolism and thrombosis of unspecified vein: Secondary | ICD-10-CM

## 2017-07-07 DIAGNOSIS — Z7901 Long term (current) use of anticoagulants: Secondary | ICD-10-CM

## 2017-07-07 LAB — POCT INR: INR: 2.1

## 2017-07-07 NOTE — Patient Instructions (Signed)
Continue coumadin 1 tablet daily except 1 1/2 tablets on Wednesdays Recheck in 6 weeks 

## 2017-08-18 ENCOUNTER — Ambulatory Visit (INDEPENDENT_AMBULATORY_CARE_PROVIDER_SITE_OTHER): Payer: Medicare Other | Admitting: *Deleted

## 2017-08-18 DIAGNOSIS — Z5181 Encounter for therapeutic drug level monitoring: Secondary | ICD-10-CM | POA: Diagnosis not present

## 2017-08-18 DIAGNOSIS — I4891 Unspecified atrial fibrillation: Secondary | ICD-10-CM | POA: Diagnosis not present

## 2017-08-18 DIAGNOSIS — I8290 Acute embolism and thrombosis of unspecified vein: Secondary | ICD-10-CM | POA: Diagnosis not present

## 2017-08-18 DIAGNOSIS — Z7901 Long term (current) use of anticoagulants: Secondary | ICD-10-CM | POA: Diagnosis not present

## 2017-08-18 LAB — POCT INR: INR: 2

## 2017-08-18 NOTE — Patient Instructions (Signed)
Increase coumadin to 1 tablet daily except 1 1/2 tablets on Wednesdays and Saturdays. Recheck in 6 weeks.

## 2017-09-29 ENCOUNTER — Other Ambulatory Visit (HOSPITAL_COMMUNITY): Payer: Self-pay | Admitting: Family Medicine

## 2017-09-29 ENCOUNTER — Ambulatory Visit (INDEPENDENT_AMBULATORY_CARE_PROVIDER_SITE_OTHER): Payer: Medicare Other | Admitting: *Deleted

## 2017-09-29 DIAGNOSIS — I4891 Unspecified atrial fibrillation: Secondary | ICD-10-CM

## 2017-09-29 DIAGNOSIS — Z7901 Long term (current) use of anticoagulants: Secondary | ICD-10-CM

## 2017-09-29 DIAGNOSIS — Z5181 Encounter for therapeutic drug level monitoring: Secondary | ICD-10-CM

## 2017-09-29 DIAGNOSIS — Z1231 Encounter for screening mammogram for malignant neoplasm of breast: Secondary | ICD-10-CM

## 2017-09-29 DIAGNOSIS — I8290 Acute embolism and thrombosis of unspecified vein: Secondary | ICD-10-CM

## 2017-09-29 LAB — POCT INR: INR: 2.5

## 2017-09-29 NOTE — Patient Instructions (Signed)
Continue coumadin 1 tablet daily except 1 1/2 tablets on Wednesdays and Saturdays Recheck in 6 weeks 

## 2017-10-25 ENCOUNTER — Ambulatory Visit (INDEPENDENT_AMBULATORY_CARE_PROVIDER_SITE_OTHER): Payer: Medicare Other | Admitting: Cardiovascular Disease

## 2017-10-25 ENCOUNTER — Encounter: Payer: Self-pay | Admitting: Cardiovascular Disease

## 2017-10-25 VITALS — BP 127/74 | HR 78 | Ht 66.0 in | Wt 108.0 lb

## 2017-10-25 DIAGNOSIS — T733XXA Exhaustion due to excessive exertion, initial encounter: Secondary | ICD-10-CM | POA: Diagnosis not present

## 2017-10-25 DIAGNOSIS — Z7901 Long term (current) use of anticoagulants: Secondary | ICD-10-CM | POA: Diagnosis not present

## 2017-10-25 DIAGNOSIS — I482 Chronic atrial fibrillation: Secondary | ICD-10-CM | POA: Diagnosis not present

## 2017-10-25 DIAGNOSIS — Z5181 Encounter for therapeutic drug level monitoring: Secondary | ICD-10-CM

## 2017-10-25 DIAGNOSIS — E7800 Pure hypercholesterolemia, unspecified: Secondary | ICD-10-CM | POA: Diagnosis not present

## 2017-10-25 DIAGNOSIS — I4821 Permanent atrial fibrillation: Secondary | ICD-10-CM

## 2017-10-25 MED ORDER — ATENOLOL 50 MG PO TABS: ORAL_TABLET | ORAL | 3 refills | Status: AC

## 2017-10-25 NOTE — Progress Notes (Signed)
SUBJECTIVE: The patient presents for follow-up of permanent atrial fibrillation. She is maintained on atenolol, diltiazem, and warfarin. She also has a history of hypothyroidism and hyperlipidemia.  ECG performed today which I personally reviewed demonstrated atrial fibrillation with a PVC and nonspecific ST segment abnormalities, heart rate 87 bpm.  She denies chest pain and shortness of breath.  She does complain of exertional fatigue which has been going on for about 2 years.  Over 2 years ago she used to be able to walk several miles a day but now can only walk around the block.  After vacuuming or mopping she has to sit down for about 5 minutes to regain her strength.  Family history: Father died of an MI at age 31 and mother died of an MI at age 26.     Review of Systems: As per "subjective", otherwise negative.  No Known Allergies  Current Outpatient Medications  Medication Sig Dispense Refill  . acetaminophen (TYLENOL) 325 MG tablet Take 650 mg by mouth every 6 (six) hours as needed for mild pain.    Marland Kitchen alendronate (FOSAMAX) 70 MG tablet Take 70 mg by mouth every 7 (seven) days. Take with a full glass of water on an empty stomach. Sundays    . Ascorbic Acid (VITAMIN C PO) Take 1 tablet by mouth daily.     Marland Kitchen atenolol (TENORMIN) 50 MG tablet TAKE 1 TABLET DAILY 90 tablet 3  . Calcium Carbonate-Vitamin D (CALCIUM + D PO) Take 1 tablet by mouth 2 (two) times daily.     Marland Kitchen diltiazem (CARDIZEM CD) 360 MG 24 hr capsule TAKE 1 CAPSULE DAILY 90 capsule 2  . docusate sodium (COLACE) 100 MG capsule Take 100 mg by mouth daily as needed for mild constipation.    Marland Kitchen levothyroxine (SYNTHROID, LEVOTHROID) 50 MCG tablet Take 50 mcg by mouth daily.      . metFORMIN (GLUCOPHAGE) 500 MG tablet Take 500 mg by mouth daily with breakfast.     . Multiple Vitamin (MULTI-VITAMIN PO) Take 1 tablet by mouth daily.     Marland Kitchen PARoxetine (PAXIL) 20 MG tablet Take 20 mg by mouth every morning.     .  rosuvastatin (CRESTOR) 10 MG tablet Take 10 mg by mouth daily.     Marland Kitchen warfarin (COUMADIN) 4 MG tablet TAKE 1 TABLET DAILY EXCEPT FRIDAY TAKE ONE AND ONE-HALF TABLETS OR AS DIRECTED 100 tablet 3   No current facility-administered medications for this visit.     Past Medical History:  Diagnosis Date  . Anxiety    stress related to son's poor health  . Arthritis    left foot in toes  . Atrial fibrillation ([place]) 2012   Newly diagnosed during hospitalization at [facility] in Sept of 2012. 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  . Chronic anticoagulation 2012  . Diabetes ([place])   . Hx of adenomatous colonic polyps 2007   Also hyperplastic  . Hypertension   . Hypothyroidism   . Osteoporosis   . Tobacco abuse, in remission    Quit in 1990    Past Surgical History:  Procedure Laterality Date  . CARDIOVERSION  05/25/2011   Procedure: CARDIOVERSION;  Surgeon: Cristopher Estimable. Lattie Haw, MD;  Location: [facility] ORS;  Service: Cardiovascular;  Laterality: N/A;  . COLONOSCOPY  02/05/2011   Procedure: COLONOSCOPY;  Surgeon: Dorothyann Peng, MD;  Location: [facility] ENDO SUITE;  Service: Endoscopy;  Laterality: N/A;  . COLONOSCOPY N/A 05/22/2016   Procedure:  COLONOSCOPY;  Surgeon: Danie Binder, MD;  Location: [facility] END[facility] SU[facility];  Service: Endoscopy;  Laterality: N/A;  2:00 pm  . COLONOSCOPY W/ POLYPECTOMY  2007  . HERNIA REPAIR  1979   right inguinal hernia repair  . POLYPECTOMY  05/22/2016   Procedure: POLYPECTOMY;  Surgeon: Danie Binder, MD;  Location: [facility] SU[facility];  Service: Endoscopy;;  transverse colon polypectomy  . right inguinal hernia repair      Social History   Socioeconomic History  . Marital status: Divorced    Spouse name: Not on file  . Number of children: Not on file  . Years of education: Not on file  . Highest education level: Not on file  Occupational History  . Not on file  Social Needs  . Financial resource strain: Not on file  . Food insecurity:    Worry: Not on file     Inability: Not on file  . Transportation needs:    Medical: Not on file    Non-medical: Not on file  Tobacco Use  . Smoking status: Former Smoker    Packs/day: 1.00    Years: 30.00    Pack years: 30.00    Types: Cigarettes    Start date: 07/07/1955    Last attempt to quit: 07/06/1998    Years since quitting: 19.3  . Smokeless tobacco: Never Used  . Tobacco comment: quit at the age of 76 years age  Substance and Sexual Activity  . Alcohol use: No    Alcohol/week: 0.0 oz  . Drug use: No  . Sexual activity: Not Currently  Lifestyle  . Physical activity:    Days per week: Not on file    Minutes per session: Not on file  . Stress: Not on file  Relationships  . Social connections:    Talks on phone: Not on file    Gets together: Not on file    Attends religious service: Not on file    Active member of club or organization: Not on file    Attends meetings of clubs or organizations: Not on file    Relationship status: Not on file  . Intimate partner violence:    Fear of current or ex partner: Not on file    Emotionally abused: Not on file    Physically abused: Not on file    Forced sexual activity: Not on file  Other Topics Concern  . Not on file  Social History Narrative  . Not on file     Vitals:   10/25/17 1007  BP: 127/74  Pulse: 78  SpO2: 98%  Weight: 108 lb (49 kg)  Height: 5\' 6"  (1.676 m)    Wt Readings from Last 3 Encounters:  10/25/17 108 lb (49 kg)  09/24/16 108 lb (49 kg)  05/22/16 116 lb (52.6 kg)     PHYSICAL EXAM General: NAD HEENT: Normal. Neck: No JVD, no thyromegaly. Lungs: Clear to auscultation bilaterally with normal respiratory effort. CV: Regular rate and irregular rhythm, normal S1/S2, no S3, no murmur. No pretibial or periankle edema.    Abdomen: Soft, nontender, no distention.  Neurologic: Alert and oriented.  Psych: Normal affect. Skin: Normal. Musculoskeletal: No gross deformities.    ECG: Most recent ECG  reviewed.   Labs: Lab Results  Component Value Date/Time   K 3.7 03/31/2011 06:00 AM   BUN 9 03/31/2011 06:00 AM   CREATININE <0.47 (L) 03/31/2011 06:00 AM   ALT 41 (H) 03/31/2011 06:00 AM   TSH 2.780  03/30/2011 03:10 PM   HGB 14.3 10/22/2011 11:40 AM     Lipids: Lab Results  Component Value Date/Time   LDLCALC 57 03/31/2011 06:00 AM   CHOL 122 03/31/2011 06:00 AM   TRIG 61 03/31/2011 06:00 AM   HDL 53 03/31/2011 06:00 AM       ASSESSMENT AND PLAN:  1.  Exertional fatigue: Unclear if this is related to elevated heart rates in the context of atrial fibrillation with exertion or possibly ischemic heart disease.  Both parents died of myocardial infarction. I will proceed with a nuclear myocardial perfusion imaging study to evaluate for ischemic heart disease (Lexiscan Myoview). I will also added an extra dose of atenolol 25 mg every morning.  2. Permanent atrial fibrillation with exertional fatigue: Please refer to discussion in #1.  Remains on long-acting diltiazem 360 mg daily. Continue atenolol 50 mg every evening and I will have her take an extra 25 mg of atenolol every morning. No bleeding problems with warfarin. INR 2.5 on 09/29/17.  3. Hyperlipidemia: On Crestor 10 mg.     Disposition: Follow up 2 months   Kate Sable, M.D., F.A.C.C.

## 2017-10-25 NOTE — Patient Instructions (Signed)
Your physician recommends that you schedule a follow-up appointment in: 2 months with Dr.Koneswaran    INCREASE Atenolol to 25 mg (1/2 tablet) on the am and 50 mg at bedtime    Your physician has requested that you have a lexiscan myoview. For further information please visit [email or web address]. Please follow instruction sheet, as given.    No lab work ordered today      Thank you for choosing [facility] !

## 2017-11-04 ENCOUNTER — Encounter (HOSPITAL_BASED_OUTPATIENT_CLINIC_OR_DEPARTMENT_OTHER)
Admission: RE | Admit: 2017-11-04 | Discharge: 2017-11-04 | Disposition: A | Discharge status: Home / Self Care | Payer: Medicare Other | Source: Ambulatory Visit | Attending: Cardiovascular Disease | Admitting: Cardiovascular Disease

## 2017-11-04 ENCOUNTER — Encounter (HOSPITAL_COMMUNITY)
Admission: RE | Admit: 2017-11-04 | Discharge: 2017-11-04 | Disposition: A | Discharge status: Home / Self Care | Payer: Medicare Other | Source: Ambulatory Visit | Attending: Cardiovascular Disease | Admitting: Cardiovascular Disease

## 2017-11-04 DIAGNOSIS — T733XXA Exhaustion due to excessive exertion, initial encounter: Secondary | ICD-10-CM | POA: Diagnosis not present

## 2017-11-04 DIAGNOSIS — X58XXXA Exposure to other specified factors, initial encounter: Secondary | ICD-10-CM | POA: Diagnosis not present

## 2017-11-04 DIAGNOSIS — I482 Chronic atrial fibrillation: Secondary | ICD-10-CM

## 2017-11-04 LAB — NM MYOCAR MULTI W/SPECT W/WALL MOTION / EF
CHL CUP NUCLEAR SSS: 1
LV sys vol: 25 mL
LVDIAVOL: 56 mL (ref 46–106)
Peak HR: 113 {beats}/min
RATE: 0.34
Rest HR: 98 {beats}/min
SDS: 1
SRS: 0
TID: 1

## 2017-11-04 MED ORDER — SODIUM CHLORIDE 0.9% FLUSH
INTRAVENOUS | Status: AC
  Administered 2017-11-04: 10 mL via INTRAVENOUS
  Filled 2017-11-04: qty 10

## 2017-11-04 MED ORDER — TECHNETIUM TC 99M TETROFOSMIN IV KIT
30.0000 | PACK | Freq: Once | INTRAVENOUS | Status: AC | PRN
  Administered 2017-11-04: 30 via INTRAVENOUS

## 2017-11-04 MED ORDER — TECHNETIUM TC 99M TETROFOSMIN IV KIT
10.0000 | PACK | Freq: Once | INTRAVENOUS | Status: AC | PRN
  Administered 2017-11-04: 9.53 via INTRAVENOUS

## 2017-11-04 MED ORDER — REGADENOSON 0.4 MG/5ML IV SOLN
INTRAVENOUS | Status: AC
  Administered 2017-11-04: 0.4 mg via INTRAVENOUS
  Filled 2017-11-04: qty 5

## 2017-11-08 ENCOUNTER — Ambulatory Visit (HOSPITAL_COMMUNITY)
Admission: RE | Admit: 2017-11-08 | Discharge: 2017-11-08 | Disposition: A | Discharge status: Home / Self Care | Payer: Medicare Other | Source: Ambulatory Visit | Attending: Family Medicine | Admitting: Family Medicine

## 2017-11-08 ENCOUNTER — Encounter (HOSPITAL_COMMUNITY): Payer: Self-pay

## 2017-11-08 DIAGNOSIS — Z1231 Encounter for screening mammogram for malignant neoplasm of breast: Secondary | ICD-10-CM | POA: Diagnosis present

## 2017-11-10 ENCOUNTER — Ambulatory Visit (INDEPENDENT_AMBULATORY_CARE_PROVIDER_SITE_OTHER): Payer: Medicare Other | Admitting: *Deleted

## 2017-11-10 DIAGNOSIS — Z5181 Encounter for therapeutic drug level monitoring: Secondary | ICD-10-CM | POA: Diagnosis not present

## 2017-11-10 DIAGNOSIS — I4891 Unspecified atrial fibrillation: Secondary | ICD-10-CM

## 2017-11-10 DIAGNOSIS — Z7901 Long term (current) use of anticoagulants: Secondary | ICD-10-CM

## 2017-11-10 DIAGNOSIS — I8290 Acute embolism and thrombosis of unspecified vein: Secondary | ICD-10-CM

## 2017-11-10 LAB — POCT INR: INR: 2.2

## 2017-11-10 NOTE — Patient Instructions (Signed)
Continue coumadin 1 tablet daily except 1 1/2 tablets on Wednesdays and Saturdays Recheck in 6 weeks 

## 2017-12-18 ENCOUNTER — Other Ambulatory Visit: Payer: Self-pay | Admitting: Cardiology

## 2017-12-23 ENCOUNTER — Emergency Department (HOSPITAL_COMMUNITY)
Admission: EM | Admit: 2017-12-23 | Discharge: 2018-01-03 | Disposition: E | Payer: Medicare Other | Attending: Emergency Medicine | Admitting: Emergency Medicine

## 2017-12-23 ENCOUNTER — Encounter (HOSPITAL_COMMUNITY): Payer: Self-pay | Admitting: Emergency Medicine

## 2017-12-23 ENCOUNTER — Emergency Department (HOSPITAL_COMMUNITY): Payer: Medicare Other

## 2017-12-23 DIAGNOSIS — Z79899 Other long term (current) drug therapy: Secondary | ICD-10-CM | POA: Insufficient documentation

## 2017-12-23 DIAGNOSIS — Z87891 Personal history of nicotine dependence: Secondary | ICD-10-CM | POA: Diagnosis not present

## 2017-12-23 DIAGNOSIS — I10 Essential (primary) hypertension: Secondary | ICD-10-CM | POA: Insufficient documentation

## 2017-12-23 DIAGNOSIS — E119 Type 2 diabetes mellitus without complications: Secondary | ICD-10-CM | POA: Diagnosis not present

## 2017-12-23 DIAGNOSIS — Z7984 Long term (current) use of oral hypoglycemic drugs: Secondary | ICD-10-CM | POA: Diagnosis not present

## 2017-12-23 DIAGNOSIS — F419 Anxiety disorder, unspecified: Secondary | ICD-10-CM | POA: Insufficient documentation

## 2017-12-23 DIAGNOSIS — R55 Syncope and collapse: Secondary | ICD-10-CM | POA: Diagnosis present

## 2017-12-23 DIAGNOSIS — I469 Cardiac arrest, cause unspecified: Secondary | ICD-10-CM | POA: Diagnosis not present

## 2017-12-23 DIAGNOSIS — E039 Hypothyroidism, unspecified: Secondary | ICD-10-CM | POA: Insufficient documentation

## 2017-12-23 DIAGNOSIS — Z7901 Long term (current) use of anticoagulants: Secondary | ICD-10-CM | POA: Insufficient documentation

## 2017-12-23 LAB — CBC WITH DIFFERENTIAL/PLATELET
BASOS PCT: 1 %
Basophils Absolute: 0 10*3/uL (ref 0.0–0.1)
EOS ABS: 0 10*3/uL (ref 0.0–0.7)
Eosinophils Relative: 0 %
HCT: 36.9 % (ref 36.0–46.0)
Hemoglobin: 11.4 g/dL — ABNORMAL LOW (ref 12.0–15.0)
Lymphocytes Relative: 64 %
Lymphs Abs: 1.3 10*3/uL (ref 0.7–4.0)
MCH: 33.5 pg (ref 26.0–34.0)
MCHC: 30.9 g/dL (ref 30.0–36.0)
MCV: 108.5 fL — ABNORMAL HIGH (ref 78.0–100.0)
Monocytes Absolute: 0.2 10*3/uL (ref 0.1–1.0)
Monocytes Relative: 8 %
NEUTROS ABS: 0.5 10*3/uL (ref 1.7–7.7)
NEUTROS PCT: 27 %
PLATELETS: 32 10*3/uL — AB (ref 150–400)
RBC: 3.4 MIL/uL — AB (ref 3.87–5.11)
RDW: 17.4 % — AB (ref 11.5–15.5)
WBC: 2 10*3/uL — AB (ref 4.0–10.5)

## 2017-12-23 LAB — I-STAT TROPONIN, ED: TROPONIN I, POC: 0.02 ng/mL (ref 0.00–0.08)

## 2017-12-23 LAB — I-STAT CG4 LACTIC ACID, ED: Lactic Acid, Venous: 16.7 mmol/L (ref 0.5–1.9)

## 2017-12-23 LAB — I-STAT CHEM 8, ED
BUN: 67 mg/dL — ABNORMAL HIGH (ref 6–20)
Calcium, Ion: 0.94 mmol/L — ABNORMAL LOW (ref 1.15–1.40)
Chloride: 103 mmol/L (ref 101–111)
Creatinine, Ser: 1.1 mg/dL — ABNORMAL HIGH (ref 0.44–1.00)
Glucose, Bld: 198 mg/dL — ABNORMAL HIGH (ref 65–99)
HEMATOCRIT: 36 % (ref 36.0–46.0)
HEMOGLOBIN: 12.2 g/dL (ref 12.0–15.0)
Potassium: 6 mmol/L — ABNORMAL HIGH (ref 3.5–5.1)
SODIUM: 133 mmol/L — AB (ref 135–145)
TCO2: 14 mmol/L — AB (ref 22–32)

## 2017-12-23 LAB — BASIC METABOLIC PANEL
Anion gap: 23 — ABNORMAL HIGH (ref 5–15)
BUN: 49 mg/dL — ABNORMAL HIGH (ref 6–20)
CALCIUM: 8 mg/dL — AB (ref 8.9–10.3)
CO2: 13 mmol/L — ABNORMAL LOW (ref 22–32)
CREATININE: 1.41 mg/dL — AB (ref 0.44–1.00)
Chloride: 102 mmol/L (ref 101–111)
GFR calc non Af Amer: 34 mL/min — ABNORMAL LOW (ref >60)
GFR, EST AFRICAN AMERICAN: 40 mL/min — AB (ref >60)
Glucose, Bld: 217 mg/dL — ABNORMAL HIGH (ref 65–99)
Potassium: 6 mmol/L — ABNORMAL HIGH (ref 3.5–5.1)
SODIUM: 138 mmol/L (ref 135–145)

## 2017-12-23 LAB — TROPONIN I: Troponin I: 0.03 ng/mL (ref <0.03)

## 2017-12-23 LAB — CBG MONITORING, ED: Glucose-Capillary: 202 mg/dL — ABNORMAL HIGH (ref 65–99)

## 2017-12-23 MED ORDER — EPINEPHRINE PF 1 MG/10ML IJ SOSY
PREFILLED_SYRINGE | INTRAMUSCULAR | Status: AC | PRN
  Administered 2017-12-23: 1 mg via INTRAVENOUS

## 2017-12-23 MED ORDER — EPINEPHRINE PF 1 MG/ML IJ SOLN
0.5000 ug/min | INTRAVENOUS | Status: DC
  Administered 2017-12-23: 9.013 ug/min via INTRAVENOUS
  Filled 2017-12-23: qty 4

## 2017-12-23 MED ORDER — NOREPINEPHRINE BITARTRATE 1 MG/ML IV SOLN: 2.0000 ug/kg/min | Freq: Once | INTRAVENOUS | Status: DC

## 2017-12-23 MED ORDER — SODIUM CHLORIDE 0.9 % IV SOLN
Freq: Once | INTRAVENOUS | Status: AC
  Administered 2017-12-23: 19:00:00 via INTRAVENOUS

## 2017-12-23 MED ORDER — NOREPINEPHRINE 4 MG/250ML-% IV SOLN
0.0000 ug/min | INTRAVENOUS | Status: DC
  Administered 2017-12-23: 4 ug/min via INTRAVENOUS

## 2017-12-23 MED ORDER — NOREPINEPHRINE BITARTRATE 1 MG/ML IV SOLN
2.0000 ug/kg/min | INTRAVENOUS | Status: DC
  Administered 2017-12-23: 2 ug/kg/min via INTRAVENOUS

## 2017-12-23 MED ORDER — EPINEPHRINE PF 1 MG/ML IJ SOLN
INTRAMUSCULAR | Status: AC
  Filled 2017-12-23: qty 4

## 2017-12-23 MED ORDER — SODIUM CHLORIDE 0.9 % IV BOLUS
1000.0000 mL | Freq: Once | INTRAVENOUS | Status: AC
  Administered 2017-12-23: 1000 mL via INTRAVENOUS

## 2017-12-27 MED FILL — Medication: Qty: 1 | Status: AC

## 2017-12-30 ENCOUNTER — Ambulatory Visit: Payer: Medicare Other | Admitting: Cardiovascular Disease

## 2018-01-03 NOTE — Code Documentation (Addendum)
Pulse check by epd with femoral pulse found. Shoes irregular pulse in 70s on zoll

## 2018-01-03 NOTE — ED Notes (Signed)
Pt brady in 40s with weak femoral pulse.

## 2018-01-03 NOTE — ED Notes (Signed)
NS bolus in progress

## 2018-01-03 NOTE — ED Notes (Signed)
Pt has received 1500 ml of ns at this time

## 2018-01-03 NOTE — ED Provider Notes (Addendum)
[facility] EMERGENCY DEPARTMENT Provider Note   CSN: [id number] Arrival date & time: Jan 22, 2018  1721     History   Chief Complaint Chief Complaint  Patient presents with  . Failure To Thrive    HPI Autumn Powers is a 82 y.o. female.  HPI Patient presents in extremis via EMS. Reportedly patient has been ill for about 4 or 5 days, not eating, not drinking, with increasing weakness. Family reports that on arrival she was awake and alert, speaking, though weak in appearance. On arrival to the emergency department the patient became cold, pulseless, and CPR was started. On arrival to the evaluation room the patient is pulseless, flaccid, with vomitus streaming from her mouth.  Level 5  Caveat secondary to extreme condition  Past Medical History:  Diagnosis Date  . Anxiety    stress related to son's poor health  . Arthritis    left foot in toes  . Atrial fibrillation ([place]) 2012   Newly diagnosed during hospitalization at [facility] in Sept of 2012. 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  . Chronic anticoagulation 2012  . Diabetes ([place])   . Hx of adenomatous colonic polyps 2007   Also hyperplastic  . Hypertension   . Hypothyroidism   . Osteoporosis   . Tobacco abuse, in remission    Quit in 1990    Patient Active Problem List   Diagnosis Date Noted  . Encounter for colonoscopy due to history of adenomatous colonic polyps   . Encounter for therapeutic drug monitoring 08/16/2013  . Tobacco abuse, in remission   . Hypothyroidism   . Osteoporosis   . Atrial fibrillation ([place]) 04/22/2011  . Chronic anticoagulation 04/06/2011  . Hx of adenomatous colonic polyps 01/27/2011    Past Surgical History:  Procedure Laterality Date  . CARDIOVERSION  05/25/2011   Procedure: CARDIOVERSION;  Surgeon: Cristopher Estimable. Lattie Haw, MD;  Location: [facility]S;  Service: Cardiovascular;  Laterality: N/A;  . COLONOSCOPY  02/05/2011   Procedure: COLONOSCOPY;  Surgeon: Dorothyann Peng, MD;   Location: [facility] ENDO SUITE;  Service: Endoscopy;  Laterality: N/A;  . COLONOSCOPY N/A 05/22/2016   Procedure: COLONOSCOPY;  Surgeon: Danie Binder, MD;  Location: [facility] ENDO SUITE;  Service: Endoscopy;  Laterality: N/A;  2:00 pm  . COLONOSCOPY W/ POLYPECTOMY  2007  . HERNIA REPAIR  1979   right inguinal hernia repair  . POLYPECTOMY  05/22/2016   Procedure: POLYPECTOMY;  Surgeon: Danie Binder, MD;  Location: [facility] END[facility];  Service: Endoscopy;;  transverse colon polypectomy  . right inguinal hernia repair       OB History   None      Home Medications    Prior to Admission medications   Medication Sig Start Date End Date Taking? Authorizing Provider  acetaminophen (TYLENOL) 325 MG tablet Take 650 mg by mouth every 6 (six) hours as needed for mild pain.    [provider]  alendronate (FOSAMAX) 70 MG tablet Take 70 mg by mouth every 7 (seven) days. Take with a full glass of water on an empty stomach. Sundays    [provider]  Ascorbic Acid (VITAMIN C PO) Take 1 tablet by mouth daily.     [provider]  atenolol (TENORMIN) 50 MG tablet Take 25 mg ( 1/2 tablet) by mouth in the am, and 1 whole  tablet 50 mg at bedtime 10/25/17   Herminio Commons, MD  Calcium Carbonate-Vitamin D (CALCIUM + D PO) Take 1  tablet by mouth 2 (two) times daily.     [provider]  diltiazem (CARDIZEM CD) 360 MG 24 hr capsule TAKE 1 CAPSULE DAILY 04/26/17   Herminio Commons, MD  docusate sodium (COLACE) 100 MG capsule Take 100 mg by mouth daily as needed for mild constipation.    [provider]  levothyroxine (SYNTHROID, LEVOTHROID) 50 MCG tablet Take 50 mcg by mouth daily.      [provider]  metFORMIN (GLUCOPHAGE) 500 MG tablet Take 500 mg by mouth daily with breakfast.     [provider]  Multiple Vitamin (MULTI-VITAMIN PO) Take 1 tablet by mouth daily.     [provider]  PARoxetine (PAXIL) 20 MG tablet Take 20 mg by mouth  every morning.     [provider]  rosuvastatin (CRESTOR) 10 MG tablet Take 10 mg by mouth daily.     [provider]  warfarin (COUMADIN) 4 MG tablet Take 1 tablet daily except 1 1/2 tablets on Wednesdays and Saturdays 12/20/17   Satira Sark, MD    Family History Family History  Problem Relation Age of Onset  . Stroke Mother   . Heart disease Father   . Heart attack Father   . Colon cancer Neg Hx     Social History Social History   Tobacco Use  . Smoking status: Former Smoker    Packs/day: 1.00    Years: 30.00    Pack years: 30.00    Types: Cigarettes    Start date: 07/07/1955    Last attempt to quit: 07/06/1998    Years since quitting: 19.4  . Smokeless tobacco: Never Used  . Tobacco comment: quit at the age of 53 years age  Substance Use Topics  . Alcohol use: No    Alcohol/week: 0.0 oz  . Drug use: No     Allergies   Patient has no known allergies.   Review of Systems Review of Systems  Unable to perform ROS: Acuity of condition     Physical Exam Updated Vital Signs BP (!) 70/47   Pulse (!) 42   Temp (!) 95 F (35 C)   Resp (!) 26   Ht 5\' 3"  (1.6 m)   SpO2 100%   BMI 19.13 kg/m   Physical Exam  Constitutional:  Distressed elderly female frail, with vomitus coming from her mouth  HENT:  Head: Normocephalic and atraumatic.  Eyes:  Pupils dilated, minimally reactive  Cardiovascular:  Pulses palpable in the femoral artery with CPR  Pulmonary/Chest:  Audible breath sounds bilaterally with assisted ventilation  Abdominal: She exhibits no distension.  Musculoskeletal: She exhibits no deformity.  Neurological:  Unresponsive  Psychiatric:  Impaired  Nursing note and vitals reviewed.    ED Treatments / Results  Labs (all labs ordered are listed, but only abnormal results are displayed) Labs Reviewed  CBC WITH DIFFERENTIAL/PLATELET - Abnormal; Notable for the following components:      Result Value   WBC 2.0 (*)    RBC  3.40 (*)    Hemoglobin 11.4 (*)    MCV 108.5 (*)    RDW 17.4 (*)    Platelets 32 (*)    All other components within normal limits  TROPONIN I - Abnormal; Notable for the following components:   Troponin I 0.03 (*)    All other components within normal limits  BASIC METABOLIC PANEL - Abnormal; Notable for the following components:   Potassium 6.0 (*)    CO2 13 (*)  Glucose, Bld 217 (*)    BUN 49 (*)    Creatinine, Ser 1.41 (*)    Calcium 8.0 (*)    GFR calc non Af Amer 34 (*)    GFR calc Af Amer 40 (*)    Anion gap 23 (*)    All other components within normal limits  CBG MONITORING, ED - Abnormal; Notable for the following components:   Glucose-Capillary 202 (*)    All other components within normal limits  I-STAT CHEM 8, ED - Abnormal; Notable for the following components:   Sodium 133 (*)    Potassium 6.0 (*)    BUN 67 (*)    Creatinine, Ser 1.10 (*)    Glucose, Bld 198 (*)    Calcium, Ion 0.94 (*)    TCO2 14 (*)    All other components within normal limits  I-STAT CG4 LACTIC ACID, ED - Abnormal; Notable for the following components:   Lactic Acid, Venous 16.70 (*)    All other components within normal limits  LACTIC ACID, PLASMA  LACTIC ACID, PLASMA  BASIC METABOLIC PANEL  I-STAT TROPONIN, ED    EKG EKG Interpretation  Date/Time:  2018-01-13 17:34:06 EDT Ventricular Rate:  115 PR Interval:    QRS Duration: 131 QT Interval:  322 QTC Calculation: 374 R Axis:   -45 Text Interpretation:  Atrial fibrillation Ventricular premature complex Nonspecific IVCD with LAD ST-t wave abnormality Abnormal ekg Confirmed by Carmin Muskrat [phone]) on January 13, 2018 6:14:08 PM   Radiology Dg Chest Portable 1 View  Result Date: January 13, 2018 CLINICAL DATA:  Status post intubation EXAM: PORTABLE CHEST 1 VIEW COMPARISON:  10/22/2011 FINDINGS: Two portable views of the chest are submitted. The initial image demonstrates a right mainstem intubation. Subsequent image with later  time stamp demonstrates adjustment of endotracheal tube with the tip partially obscured by support devices but suspected to be at or near the carina. Esophageal tube extends below the diaphragm but is non visualized. Moderate diffuse hazy and ground-glass pulmonary opacity. Hyperinflation. Stable cardiomediastinal silhouette with aortic atherosclerosis. No pneumothorax. Nodular opacity in the right lower lung, suspect nipple shadow. IMPRESSION: 1. Final portable view chest demonstrates tip of endotracheal tube to be at or near the carina. Esophageal tube tip extends below diaphragm but the tip is non included 2. Moderate diffuse bilateral hazy and ground-glass opacity, may reflect pulmonary edema or diffuse pneumonia. Electronically Signed   By: Donavan Foil M.D.   On: 2018-01-13 18:25    Procedures Procedures (including critical care time)  INTUBATION Performed by: Carmin Muskrat  Required items: required blood products, implants, devices, and special equipment available Patient identity confirmed: provided demographic data and hospital-assigned identification number Time out: Immediately prior to procedure a "time out" was called to verify the correct patient, procedure, equipment, support staff and site/side marked as required.  Indications: airway protection  Intubation method: Glidescope Laryngoscopy   Preoxygenation: BVM  Sedation not needed  Tube Size: 7.5 cuffed  Post-procedure assessment: chest rise and ETCO2 monitor Breath sounds: equal and absent over the epigastrium Tube secured with: ETT holder Chest x-ray interpreted by radiologist and me.  Chest x-ray findings: endotracheal tube in appropriate position  Patient tolerated the procedure well with no immediate complications.   Cardiopulmonary Resuscitation (CPR) Procedure Note Directed/Performed by: Carmin Muskrat I personally directed ancillary staff and/or performed CPR in an effort to regain return of spontaneous  circulation and to maintain cardiac, neuro and systemic perfusion.     Medications Ordered in  ED Medications  EPINEPHrine (ADRENALIN) 4 mg in dextrose 5 % 250 mL (0.016 mg/mL) infusion (9.013 mcg/min Intravenous New Bag/Given 2018-01-11 1804)  norepinephrine (LEVOPHED) injection (has no administration in time range)  EPINEPHrine (ADRENALIN) 1 MG/10ML injection (1 mg Intravenous Given 01-11-2018 1723)  EPINEPHrine (ADRENALIN) 1 MG/10ML injection (1 mg Intravenous Given 01/11/2018 1727)  EPINEPHrine (ADRENALIN) 1 MG/10ML injection (1 mg Intravenous Given 2018-01-11 1803)  sodium chloride 0.9 % bolus 1,000 mL (1,000 mLs Intravenous New Bag/Given 01/11/2018 1730)   Immediately after arrival the patient continued to receive CPR via ACLS protocol, with epinephrine boluses. After several rounds of CPR the patient had return of spontaneous circulation.  When subsequently she was intubated, without comp occasion on the first pass via video laryngoscopy.  I then discussed the patient's presentation with family members who noted that the patient has not had a prior advanced directive.  On repeat exam the patient has hypertension, and worsening bradycardia, epinephrine drip was complicated by computer system. Bedside x-ray demonstrates opacification of the left lung, and ET tube was pulled back.  Repeat x-ray with aeration of both lungs.  Patient did have one additional episode of bradycardia, hypotension, pulselessness requiring additional CPR which was performed by nursing staff, and myself, though this improved after epinephrine drip was initiated. Subsequently, patient started on a Levophed drip as well.  6:47 PM Patient remains bradycardic, hypotensive, hypothermic, though improved from arrival. Patient will stay hypothermic, with epinephrine, levo fed, mechanical ventilation, and require admission to the ICU for further evaluation, management. Initial Impression / Assessment and Plan / ED Course  I have  reviewed the triage vital signs and the nursing notes.  Pertinent labs & imaging results that were available during my care of the patient were reviewed by me and considered in my medical decision making (see chart for details).  Elderly female presents in extremis, actively receiving CPR via EMS personnel on arrival. Patient's presentation concerning for failure to thrive, accompanied by cardiac arrest. Patient received resuscitation, multiple continuous cardio active medications, mechanical ventilation.  8:33 PM After maximum titration of Levophed and epinephrine, with ongoing mechanical ventilation, the patient continued to have blood pressure, no perfusion, and after family was allowed to witness ongoing resuscitation efforts, the patient had loss of pulses, and no additional CPR was performed, after speaking with the family.  Time of Death 2026-10-19, Dr. Lorriane Shire notified via secure message    Final Clinical Impressions(s) / ED Diagnoses  Cardiac arrest Lactic acidosis   CRITICAL CARE Performed by: Carmin Muskrat Total critical care time: 50 minutes Critical care time was exclusive of separately billable procedures and treating other patients. Critical care was necessary to treat or prevent imminent or life-threatening deterioration. Critical care was time spent personally by me on the following activities: development of treatment plan with patient and/or surrogate as well as nursing, discussions with consultants, evaluation of patient's response to treatment, examination of patient, obtaining history from patient or surrogate, ordering and performing treatments and interventions, ordering and review of laboratory studies, ordering and review of radiographic studies, pulse oximetry and re-evaluation of patient's condition.    Carmin Muskrat, MD 2018-01-11 2034    Carmin Muskrat, MD 01/11/18 10/18/2033

## 2018-01-03 NOTE — ED Notes (Signed)
Pt finished first L bolus 2nd started

## 2018-01-03 NOTE — ED Notes (Signed)
MD, Vanita Panda spoke with family regarding Pt. Family agreed with MD to stop further medication interventions.

## 2018-01-03 NOTE — ED Triage Notes (Addendum)
Ems was called by family due to pt not eating and drinking. Pt was a/o with ems and stood to get on stretcher. Per ems pt became cold and no pulse when parked at ED. Pt needed suction upon arrival due to light brown liquid coming from mouth. Smelled of stool. abd distended but soft.

## 2018-01-03 NOTE — Progress Notes (Signed)
eLink Physician-Brief Progress Note Patient Name: Autumn Powers DOB: 06-17-1936 MRN: [id number]   Date of Service  December 29, 2017  HPI/Events of Note  Call from Dr Vanita Panda of [facility] - Cardiac arres and ROSC with seerey shock needing epi gtt  eICU Interventions  Admit [place] Too sick to transfer to [facility]     Intervention Category Minor Interventions: Other:  Brand Males 12-29-2017, 7:45 PM

## 2018-01-03 NOTE — ED Notes (Signed)
CRITICAL VALUE ALERT  Critical Value:  Troponin 0.03,   Date & Time Notied:2018-01-09 1829    Provider Notified: dr Vanita Panda  Orders Received/Actions taken:

## 2018-01-03 NOTE — ED Notes (Signed)
Per Respiratory, Pt extubated @ 2040.

## 2018-01-03 NOTE — Code Documentation (Signed)
cpr in progress

## 2018-01-03 NOTE — ED Notes (Signed)
RN contacted [place] [facility] @ 2039. Pt is not a candidate for organ donation. Referral # [id number].

## 2018-01-03 NOTE — ED Notes (Signed)
Family has been updated by edp and now visiting.

## 2018-01-03 NOTE — ED Notes (Signed)
A flutter noted on montior in 50-60.

## 2018-01-03 NOTE — ED Notes (Signed)
Lab stated postassium elevated but blood was hemolyzed. Will put in for redraw. edp aware

## 2018-01-03 NOTE — ED Notes (Signed)
Pulse chest. No pulse. cpr cont.

## 2018-01-03 NOTE — Code Documentation (Signed)
Pulse check with femoral pulse present. ekg obtained

## 2018-01-03 DEATH — deceased

## 2019-03-25 IMAGING — CR DG CHEST 1V PORT
1 series · 2 of 2 positions shown · non-contrast
Comparison: 10/22/2011

CLINICAL DATA: Status post intubation

EXAM:
PORTABLE CHEST 1 VIEW

[Series 1: ap portable · 0.17mm/px · 2 of 2 slices shown]
[im 1/2]
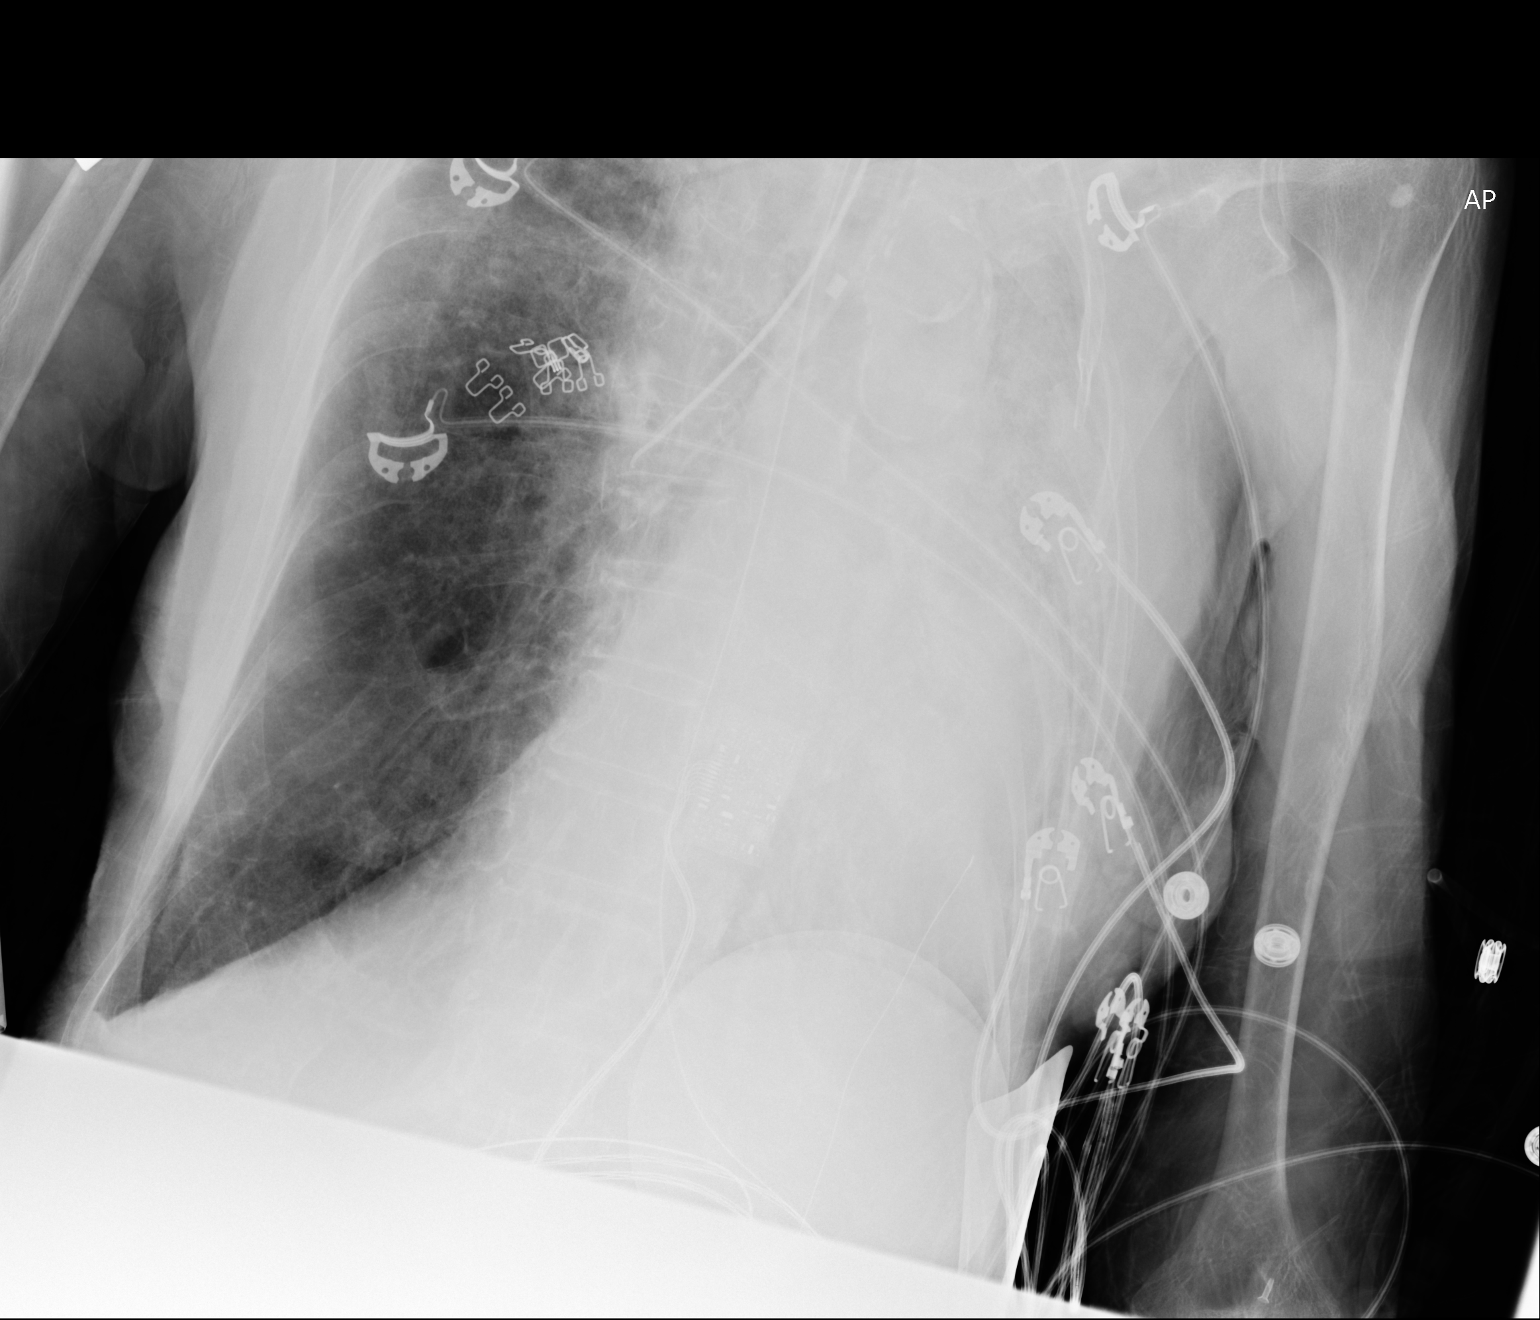
[im 2/2]
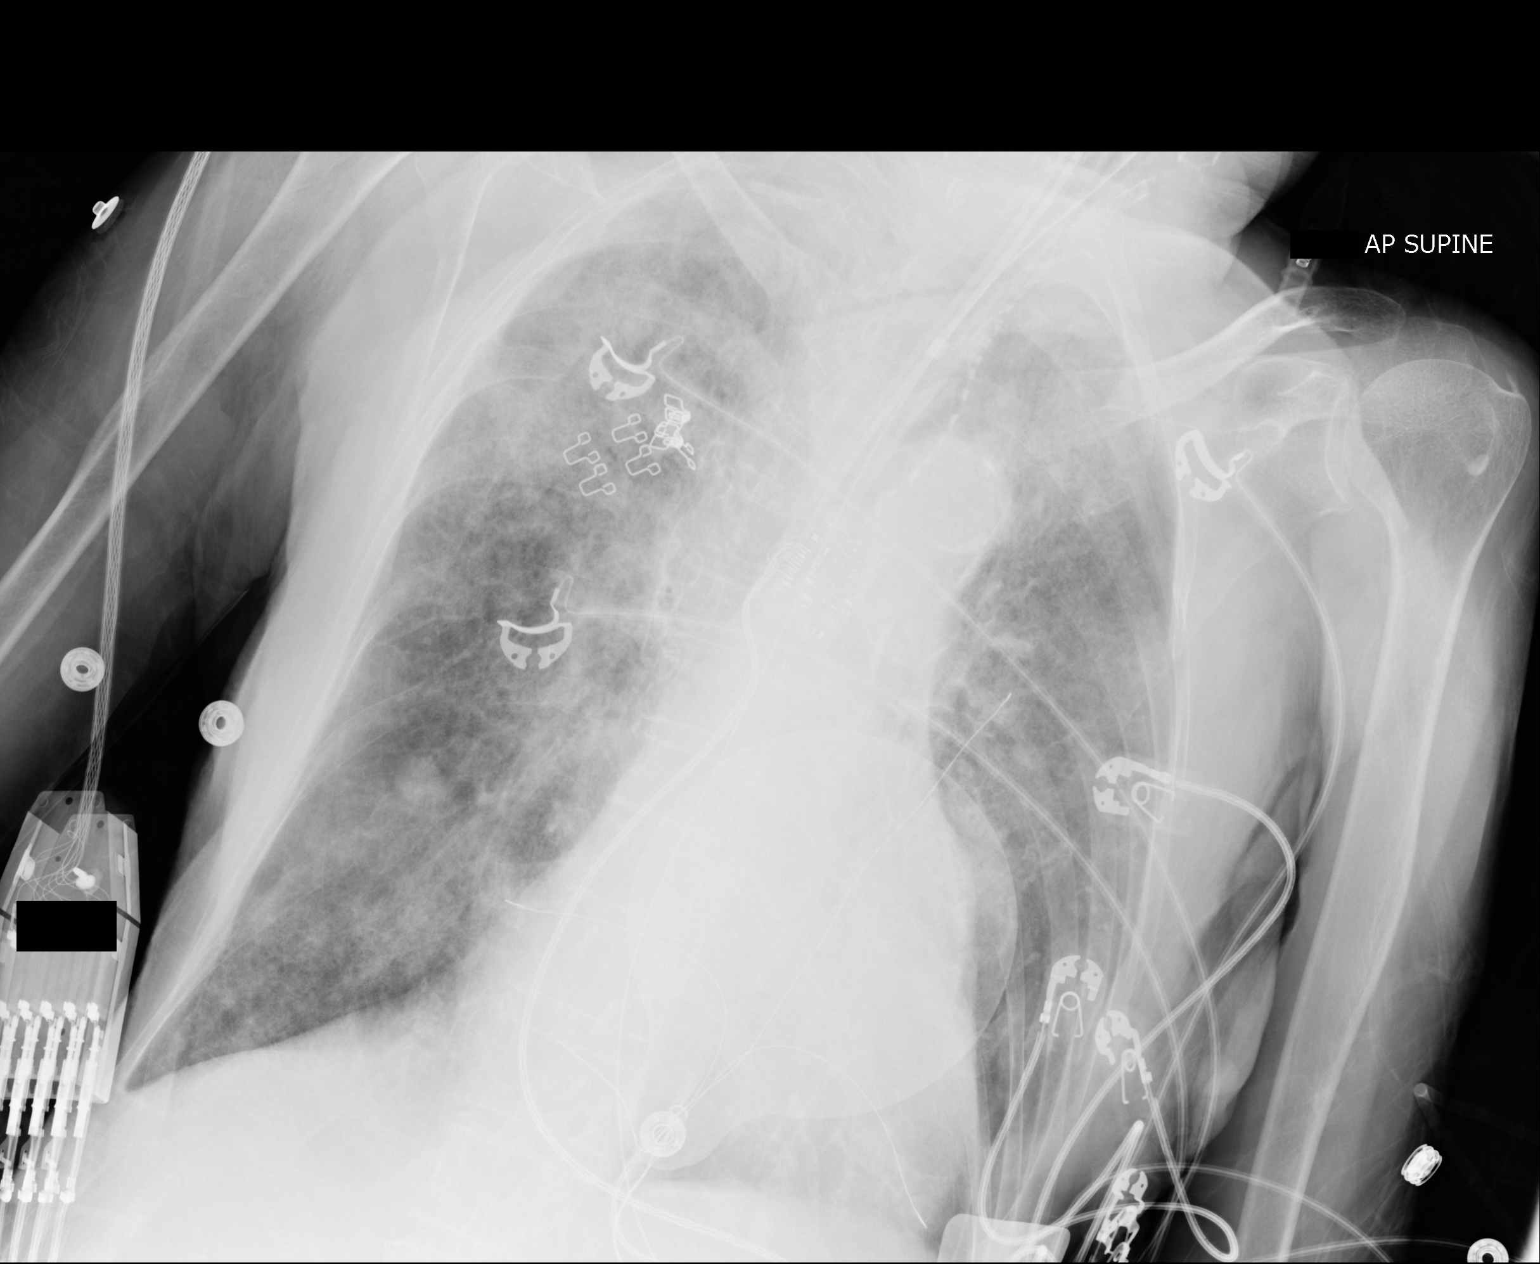

[2 of 2 positions shown; findings below may reference images not displayed]

FINDINGS: Two portable views of the chest are submitted. The initial image
demonstrates a right mainstem intubation. Subsequent image with
later time stamp demonstrates adjustment of endotracheal tube with
the tip partially obscured by support devices but suspected to be at
or near the carina. Esophageal tube extends below the diaphragm but
is non visualized. Moderate diffuse hazy and ground-glass pulmonary
opacity. Hyperinflation. Stable cardiomediastinal silhouette with
aortic atherosclerosis. No pneumothorax. Nodular opacity in the
right lower lung, suspect nipple shadow.
IMPRESSION: 1. Final portable view chest demonstrates tip of endotracheal tube
to be at or near the carina. Esophageal tube tip extends below
diaphragm but the tip is non included
2. Moderate diffuse bilateral hazy and ground-glass opacity, may
reflect pulmonary edema or diffuse pneumonia.
